# Patient Record
Sex: Male | Born: 1960 | Race: White | Hispanic: No | Marital: Married | Smoking: Former smoker
Health system: Southern US, Community
[De-identification: ages and names within clinical notes are randomized; demographics above are authoritative.]

## PROBLEM LIST (undated history)

## (undated) DIAGNOSIS — N400 Enlarged prostate without lower urinary tract symptoms: Secondary | ICD-10-CM

## (undated) DIAGNOSIS — R0609 Other forms of dyspnea: Secondary | ICD-10-CM

## (undated) DIAGNOSIS — R079 Chest pain, unspecified: Secondary | ICD-10-CM

## (undated) DIAGNOSIS — G4730 Sleep apnea, unspecified: Secondary | ICD-10-CM

## (undated) DIAGNOSIS — R0600 Dyspnea, unspecified: Secondary | ICD-10-CM

## (undated) HISTORY — DX: Other forms of dyspnea: R06.09

## (undated) HISTORY — DX: Benign prostatic hyperplasia without lower urinary tract symptoms: N40.0

## (undated) HISTORY — PX: TONSILLECTOMY: SUR1361

## (undated) HISTORY — DX: Dyspnea, unspecified: R06.00

## (undated) HISTORY — DX: Chest pain, unspecified: R07.9

---

## 2000-08-21 ENCOUNTER — Ambulatory Visit (HOSPITAL_BASED_OUTPATIENT_CLINIC_OR_DEPARTMENT_OTHER): Admission: RE | Admit: 2000-08-21 | Discharge: 2000-08-21 | Payer: Self-pay | Admitting: Family Medicine

## 2011-09-09 HISTORY — PX: HERNIA REPAIR: SHX51

## 2012-02-09 HISTORY — PX: COLONOSCOPY: SHX174

## 2012-02-13 ENCOUNTER — Other Ambulatory Visit: Payer: Self-pay | Admitting: Orthopedic Surgery

## 2012-03-13 ENCOUNTER — Encounter (HOSPITAL_COMMUNITY): Payer: Self-pay | Admitting: Pharmacy Technician

## 2012-03-13 HISTORY — PX: COLONOSCOPY: SHX174

## 2012-03-15 ENCOUNTER — Other Ambulatory Visit (HOSPITAL_COMMUNITY): Payer: Self-pay | Admitting: Orthopedic Surgery

## 2012-03-18 ENCOUNTER — Encounter (HOSPITAL_COMMUNITY)
Admission: RE | Admit: 2012-03-18 | Discharge: 2012-03-18 | Disposition: A | Discharge status: Home / Self Care | Payer: 59 | Source: Ambulatory Visit | Attending: Orthopedic Surgery | Admitting: Orthopedic Surgery

## 2012-03-18 ENCOUNTER — Encounter (HOSPITAL_COMMUNITY): Payer: Self-pay

## 2012-03-18 HISTORY — DX: Sleep apnea, unspecified: G47.30

## 2012-03-18 LAB — CBC
HCT: 41.9 % (ref 39.0–52.0)
Hemoglobin: 14.3 g/dL (ref 13.0–17.0)
MCHC: 34.1 g/dL (ref 30.0–36.0)
MCV: 89.7 fL (ref 78.0–100.0)
RDW: 13.2 % (ref 11.5–15.5)

## 2012-03-18 LAB — SURGICAL PCR SCREEN: Staphylococcus aureus: NEGATIVE

## 2012-03-18 NOTE — Patient Instructions (Addendum)
Dillon Richards  03/18/2012   Your procedure is scheduled on: 03/20/12  Report to [facility] at [phone].  Call this number if you have problems the morning of surgery 336-: [phone]   Remember:   Do not eat food or drink liquids After Midnight.     Take these medicines the morning of surgery with A SIP OF WATER: no medications needed   Do not wear jewelry, make-up or nail polish.  Do not wear lotions, powders, or perfumes. You may wear deodorant.  Do not shave 48 hours prior to surgery. Men may shave face and neck.  Do not bring valuables to the hospital.  Contacts, dentures or bridgework may not be worn into surgery.  Leave suitcase in the car. After surgery it may be brought to your room.  For patients admitted to the hospital, checkout time is 11:00 AM the day of discharge.   Patients discharged the day of surgery will not be allowed to drive home.  Name and phone number of your driver: [name] [phone]   Special Instructions: Shower using CHG 2 nights before surgery and the night before surgery.  If you shower the day of surgery use CHG.  Use special wash - you have one bottle of CHG for all showers.  You should use approximately 1/3 of the bottle for each shower.   Please read over the following fact sheets that you were given: MRSA Information.  [name], RN  pre op nurse call if needed [phone]    FAILURE TO FOLLOW THESE INSTRUCTIONS MAY RESULT IN CANCELLATION OF YOUR SURGERY   Patient Signature: ___________________________________________

## 2012-03-20 ENCOUNTER — Ambulatory Visit (HOSPITAL_COMMUNITY): Payer: 59 | Admitting: Anesthesiology

## 2012-03-20 ENCOUNTER — Ambulatory Visit (HOSPITAL_COMMUNITY)
Admission: RE | Admit: 2012-03-20 | Discharge: 2012-03-20 | Disposition: A | Discharge status: Home / Self Care | Payer: 59 | Source: Ambulatory Visit | Attending: Orthopedic Surgery | Admitting: Orthopedic Surgery

## 2012-03-20 ENCOUNTER — Encounter (HOSPITAL_COMMUNITY)
Admission: RE | Disposition: A | Discharge status: Home / Self Care | Payer: Self-pay | Source: Ambulatory Visit | Attending: Orthopedic Surgery

## 2012-03-20 ENCOUNTER — Encounter (HOSPITAL_COMMUNITY): Payer: Self-pay | Admitting: Anesthesiology

## 2012-03-20 ENCOUNTER — Encounter (HOSPITAL_COMMUNITY): Payer: Self-pay | Admitting: *Deleted

## 2012-03-20 ENCOUNTER — Encounter (HOSPITAL_COMMUNITY): Payer: Self-pay

## 2012-03-20 DIAGNOSIS — Z5331 Laparoscopic surgical procedure converted to open procedure: Secondary | ICD-10-CM | POA: Insufficient documentation

## 2012-03-20 DIAGNOSIS — G4730 Sleep apnea, unspecified: Secondary | ICD-10-CM | POA: Insufficient documentation

## 2012-03-20 DIAGNOSIS — Z79899 Other long term (current) drug therapy: Secondary | ICD-10-CM | POA: Insufficient documentation

## 2012-03-20 DIAGNOSIS — Z9889 Other specified postprocedural states: Secondary | ICD-10-CM

## 2012-03-20 DIAGNOSIS — M19019 Primary osteoarthritis, unspecified shoulder: Secondary | ICD-10-CM | POA: Insufficient documentation

## 2012-03-20 DIAGNOSIS — M25819 Other specified joint disorders, unspecified shoulder: Secondary | ICD-10-CM | POA: Insufficient documentation

## 2012-03-20 DIAGNOSIS — Z01812 Encounter for preprocedural laboratory examination: Secondary | ICD-10-CM | POA: Insufficient documentation

## 2012-03-20 HISTORY — PX: SHOULDER ARTHROSCOPY WITH SUBACROMIAL DECOMPRESSION: SHX5684

## 2012-03-20 SURGERY — SHOULDER ARTHROSCOPY WITH SUBACROMIAL DECOMPRESSION
Anesthesia: Regional | Site: Shoulder | Laterality: Left | Wound class: Clean

## 2012-03-20 MED ORDER — LIDOCAINE HCL (CARDIAC) 20 MG/ML IV SOLN
INTRAVENOUS | Status: DC | PRN
  Administered 2012-03-20: 100 mg via INTRAVENOUS

## 2012-03-20 MED ORDER — METHOCARBAMOL 500 MG PO TABS: 500.0000 mg | ORAL_TABLET | Freq: Four times a day (QID) | ORAL | Status: DC

## 2012-03-20 MED ORDER — ACETAMINOPHEN 10 MG/ML IV SOLN
INTRAVENOUS | Status: AC
  Filled 2012-03-20: qty 100

## 2012-03-20 MED ORDER — MEPERIDINE HCL 50 MG/ML IJ SOLN: 6.2500 mg | INTRAMUSCULAR | Status: DC | PRN

## 2012-03-20 MED ORDER — SUCCINYLCHOLINE CHLORIDE 20 MG/ML IJ SOLN
INTRAMUSCULAR | Status: DC | PRN
  Administered 2012-03-20: 100 mg via INTRAVENOUS

## 2012-03-20 MED ORDER — BUPIVACAINE-EPINEPHRINE (PF) 0.5% -1:200000 IJ SOLN
INTRAMUSCULAR | Status: AC
  Filled 2012-03-20: qty 10

## 2012-03-20 MED ORDER — LACTATED RINGERS IR SOLN
Status: DC | PRN
  Administered 2012-03-20: 6000

## 2012-03-20 MED ORDER — ACETAMINOPHEN 10 MG/ML IV SOLN: 1000.0000 mg | Freq: Once | INTRAVENOUS | Status: DC | PRN

## 2012-03-20 MED ORDER — FENTANYL CITRATE 0.05 MG/ML IJ SOLN: 50.0000 ug | INTRAMUSCULAR | Status: DC | PRN

## 2012-03-20 MED ORDER — PROMETHAZINE HCL 25 MG/ML IJ SOLN: 6.2500 mg | INTRAMUSCULAR | Status: DC | PRN

## 2012-03-20 MED ORDER — MIDAZOLAM HCL 10 MG/2ML IJ SOLN: 1.0000 mg | INTRAMUSCULAR | Status: DC | PRN

## 2012-03-20 MED ORDER — BUPIVACAINE-EPINEPHRINE 0.5% -1:200000 IJ SOLN
INTRAMUSCULAR | Status: DC | PRN
  Administered 2012-03-20: 10 mL

## 2012-03-20 MED ORDER — EPINEPHRINE HCL 1 MG/ML IJ SOLN
INTRAMUSCULAR | Status: AC
  Filled 2012-03-20: qty 2

## 2012-03-20 MED ORDER — EPINEPHRINE HCL 1 MG/ML IJ SOLN
INTRAMUSCULAR | Status: DC | PRN
  Administered 2012-03-20: 2 mg

## 2012-03-20 MED ORDER — OXYCODONE HCL 5 MG/5ML PO SOLN: 5.0000 mg | Freq: Once | ORAL | Status: DC | PRN

## 2012-03-20 MED ORDER — ROPIVACAINE HCL 5 MG/ML IJ SOLN
INTRAMUSCULAR | Status: DC | PRN
  Administered 2012-03-20: 30 mL

## 2012-03-20 MED ORDER — MIDAZOLAM HCL 5 MG/5ML IJ SOLN
INTRAMUSCULAR | Status: DC | PRN
  Administered 2012-03-20: 2 mg via INTRAVENOUS

## 2012-03-20 MED ORDER — FENTANYL CITRATE 0.05 MG/ML IJ SOLN: INTRAMUSCULAR | Status: DC | PRN

## 2012-03-20 MED ORDER — DEXAMETHASONE SODIUM PHOSPHATE 4 MG/ML IJ SOLN
INTRAMUSCULAR | Status: DC | PRN
  Administered 2012-03-20: 10 mg via INTRAVENOUS

## 2012-03-20 MED ORDER — CEFAZOLIN SODIUM-DEXTROSE 2-3 GM-% IV SOLR
INTRAVENOUS | Status: AC
  Filled 2012-03-20: qty 50

## 2012-03-20 MED ORDER — OXYCODONE-ACETAMINOPHEN 7.5-325 MG PO TABS: 1.0000 | ORAL_TABLET | ORAL | Status: DC | PRN

## 2012-03-20 MED ORDER — OXYCODONE HCL 5 MG PO TABS: 5.0000 mg | ORAL_TABLET | Freq: Once | ORAL | Status: DC | PRN

## 2012-03-20 MED ORDER — LACTATED RINGERS IV SOLN
INTRAVENOUS | Status: DC
  Administered 2012-03-20: 1000 mL via INTRAVENOUS

## 2012-03-20 MED ORDER — FENTANYL CITRATE 0.05 MG/ML IJ SOLN
INTRAMUSCULAR | Status: DC | PRN
  Administered 2012-03-20 (×2): 50 ug via INTRAVENOUS

## 2012-03-20 MED ORDER — HYDROMORPHONE HCL PF 1 MG/ML IJ SOLN: 0.2500 mg | INTRAMUSCULAR | Status: DC | PRN

## 2012-03-20 MED ORDER — MIDAZOLAM HCL 5 MG/5ML IJ SOLN: INTRAMUSCULAR | Status: DC | PRN

## 2012-03-20 MED ORDER — ROPIVACAINE HCL 5 MG/ML IJ SOLN
INTRAMUSCULAR | Status: AC
  Filled 2012-03-20: qty 30

## 2012-03-20 MED ORDER — CEFAZOLIN SODIUM-DEXTROSE 2-3 GM-% IV SOLR
2.0000 g | INTRAVENOUS | Status: AC
  Administered 2012-03-20: 2 g via INTRAVENOUS

## 2012-03-20 MED ORDER — PROPOFOL 10 MG/ML IV BOLUS
INTRAVENOUS | Status: DC | PRN
  Administered 2012-03-20: 200 mg via INTRAVENOUS

## 2012-03-20 MED ORDER — ACETAMINOPHEN 10 MG/ML IV SOLN
INTRAVENOUS | Status: DC | PRN
  Administered 2012-03-20: 1000 mg via INTRAVENOUS

## 2012-03-20 MED ORDER — POVIDONE-IODINE 7.5 % EX SOLN: Freq: Once | CUTANEOUS | Status: DC

## 2012-03-20 SURGICAL SUPPLY — 46 items
BLADE 4.2CUDA (BLADE) ×2 IMPLANT
BLADE OSCILLATING/SAGITTAL (BLADE) ×2
BLADE SURG SZ11 CARB STEEL (BLADE) ×2 IMPLANT
BLADE SW THK.38XMED LNG THN (BLADE) IMPLANT
BOOTIES KNEE HIGH SLOAN (MISCELLANEOUS) ×2 IMPLANT
BUR OVAL 4.0 (BURR) ×2 IMPLANT
CLOTH BEACON ORANGE TIMEOUT ST (SAFETY) ×2 IMPLANT
CLSR STERI-STRIP ANTIMIC 1/2X4 (GAUZE/BANDAGES/DRESSINGS) ×1 IMPLANT
DECANTER SPIKE VIAL GLASS SM (MISCELLANEOUS) ×1 IMPLANT
DRAPE POUCH INSTRU U-SHP 10X18 (DRAPES) ×1 IMPLANT
DRAPE SHOULDER BEACH CHAIR (DRAPES) ×2 IMPLANT
DRAPE U-SHAPE 47X51 STRL (DRAPES) ×2 IMPLANT
DRSG ADAPTIC 3X8 NADH LF (GAUZE/BANDAGES/DRESSINGS) ×1 IMPLANT
DRSG EMULSION OIL 3X3 NADH (GAUZE/BANDAGES/DRESSINGS) ×2 IMPLANT
DURAPREP 26ML APPLICATOR (WOUND CARE) ×2 IMPLANT
ELECT REM PT RETURN 9FT ADLT (ELECTROSURGICAL) ×2
ELECTRODE REM PT RTRN 9FT ADLT (ELECTROSURGICAL) IMPLANT
GLOVE BIO SURGEON STRL SZ7.5 (GLOVE) ×2 IMPLANT
GLOVE BIO SURGEON STRL SZ8 (GLOVE) ×4 IMPLANT
GLOVE BIOGEL M 8.0 STRL (GLOVE) ×2 IMPLANT
GLOVE ECLIPSE 8.0 STRL XLNG CF (GLOVE) ×3 IMPLANT
GLOVE INDICATOR 8.0 STRL GRN (GLOVE) ×4 IMPLANT
GOWN STRL REIN XL XLG (GOWN DISPOSABLE) ×4 IMPLANT
KIT BASIN OR (CUSTOM PROCEDURE TRAY) ×1 IMPLANT
KIT POSITION SHOULDER SCHLEI (MISCELLANEOUS) ×2 IMPLANT
MANIFOLD NEPTUNE II (INSTRUMENTS) ×2 IMPLANT
NDL SPNL 18GX3.5 QUINCKE PK (NEEDLE) ×1 IMPLANT
NEEDLE SPNL 18GX3.5 QUINCKE PK (NEEDLE) ×2 IMPLANT
PACK SHOULDER CUSTOM OPM052 (CUSTOM PROCEDURE TRAY) ×2 IMPLANT
POSITIONER SURGICAL ARM (MISCELLANEOUS) ×2 IMPLANT
SET ARTHROSCOPY TUBING (MISCELLANEOUS) ×2
SET ARTHROSCOPY TUBING LN (MISCELLANEOUS) ×1 IMPLANT
SLING ARM IMMOBILIZER LRG (SOFTGOODS) ×1 IMPLANT
SLING ARM IMMOBILIZER MED (SOFTGOODS) ×2 IMPLANT
SPONGE GAUZE 4X4 12PLY (GAUZE/BANDAGES/DRESSINGS) ×1 IMPLANT
SPONGE SURGIFOAM ABS GEL 100 (HEMOSTASIS) ×1 IMPLANT
STAPLER VISISTAT (STAPLE) ×1 IMPLANT
STRAP CHIN BCCS-OSI (MISCELLANEOUS) ×2 IMPLANT
SUT BONE WAX W31G (SUTURE) ×1 IMPLANT
SUT ETHILON 4 0 PS 2 18 (SUTURE) ×2 IMPLANT
SUT VIC AB 1 CT1 27 (SUTURE) ×2
SUT VIC AB 1 CT1 27XBRD ANTBC (SUTURE) IMPLANT
SUT VIC AB 2-0 CT1 27 (SUTURE) ×2
SUT VIC AB 2-0 CT1 27XBRD (SUTURE) IMPLANT
TUBING CONNECTING 10 (TUBING) ×4 IMPLANT
WAND 90 DEG TURBOVAC W/CORD (SURGICAL WAND) ×2 IMPLANT

## 2012-03-20 NOTE — Anesthesia Postprocedure Evaluation (Signed)
Anesthesia Post Note  Patient: Dillon Richards  Procedure(s) Performed: Procedure(s) (LRB): SHOULDER ARTHROSCOPY WITH SUBACROMIAL DECOMPRESSION (Left)  Anesthesia type: General with left interscalene block  Patient location: PACU  Post pain: Pain level controlled  Post assessment: Post-op Vital signs reviewed  Last Vitals: BP 129/80  Pulse 65  Temp 36.4 C (Oral)  Resp 15  SpO2 100%  Post vital signs: Reviewed  Level of consciousness: sedated  Complications: No apparent anesthesia complications

## 2012-03-20 NOTE — H&P (Signed)
Din Bookwalter is an 51 y.o. male.   Chief Complaint:painful lt shoulder HPI:MRI demonstrates ac arthritis and impingement  anatomy  Past Medical History  Diagnosis Date  . Sleep apnea     Past Surgical History  Procedure Date  . Hernia repair june 2013  . Tonsillectomy 51 years old  . Colonoscopy November 2013    History reviewed. No pertinent family history. Social History:  reports that he has never smoked. He has never used smokeless tobacco. He reports that he does not drink alcohol or use illicit drugs.  Allergies: No Known Allergies  Medications Prior to Admission  Medication Sig Dispense Refill  . finasteride (PROSCAR) 5 MG tablet Take 5 mg by mouth daily.      Marland Kitchen terbinafine (LAMISIL) 250 MG tablet Take 250 mg by mouth daily.        Results for orders placed during the hospital encounter of 03/18/12 (from the past 48 hour(s))  SURGICAL PCR SCREEN     Status: Normal   Collection Time   03/18/12  9:15 AM      Component Value Range Comment   MRSA, PCR NEGATIVE  NEGATIVE    Staphylococcus aureus NEGATIVE  NEGATIVE   CBC     Status: Normal   Collection Time   03/18/12  9:25 AM      Component Value Range Comment   WBC 4.1  4.0 - 10.5 K/uL    RBC 4.67  4.22 - 5.81 MIL/uL    Hemoglobin 14.3  13.0 - 17.0 g/dL    HCT 16.1  09.6 - 04.5 %    MCV 89.7  78.0 - 100.0 fL    MCH 30.6  26.0 - 34.0 pg    MCHC 34.1  30.0 - 36.0 g/dL    RDW 40.9  81.1 - 91.4 %    Platelets 285  150 - 400 K/uL    No results found.  ROS  Blood pressure 136/90, pulse 75, temperature 97.7 F (36.5 C), temperature source Oral, resp. rate 18, SpO2 100.00%. Physical Exam   Assessment/Plan Painful lt shoulder due to ac arthritis and rotator cuff impingement Lt shoulder arthroscopy with SAD and open distal clavicle resection  Mukhtar Shams P 03/20/2012, 8:46 AM

## 2012-03-20 NOTE — Anesthesia Procedure Notes (Addendum)
Procedure Name: Intubation Date/Time: 03/20/2012 8:59 AM Performed by: Maris Berger T Pre-anesthesia Checklist: Patient identified, Emergency Drugs available, Suction available and Patient being monitored Patient Re-evaluated:Patient Re-evaluated prior to inductionOxygen Delivery Method: Circle System Utilized Preoxygenation: Pre-oxygenation with 100% oxygen Intubation Type: IV induction Ventilation: Mask ventilation without difficulty Laryngoscope Size: Mac and 4 Grade View: Grade II Tube type: Oral Number of attempts: 1 Airway Equipment and Method: stylet and oral airway Placement Confirmation: ETT inserted through vocal cords under direct vision,  positive ETCO2 and breath sounds checked- equal and bilateral Secured at: 24 cm Tube secured with: Tape Dental Injury: Teeth and Oropharynx as per pre-operative assessment    Anesthesia Regional Block:  Interscalene brachial plexus block  Pre-Anesthetic Checklist: ,, timeout performed, Correct Patient, Correct Site, Correct Laterality, Correct Procedure, Correct Position, site marked, Risks and benefits discussed,  Surgical consent,  Pre-op evaluation,  At surgeon's request and post-op pain management  Laterality: Left  Prep: chloraprep       Needles:  Injection technique: Single-shot  Needle Type: Stimiplex     Needle Length:cm 9 cm Needle Gauge: 20 and 20 G  Needle insertion depth: 4 cm   Additional Needles:  Procedures: ultrasound guided (picture in chart) and nerve stimulator  Motor weakness within 10 minutes. Interscalene brachial plexus block  Nerve Stimulator or Paresthesia:  Response: Deltoid, 0.4 mA, 2 ms,   Additional Responses:   Narrative:  Start time: 03/20/2012 8:33 AM End time: 03/20/2012 8:42 AM Injection made incrementally with aspirations every 5 mL.  Performed by: Personally  Anesthesiologist: Rotha Cassels  Additional Notes: Patient tolerated the procedure well without  complications  Interscalene brachial plexus block

## 2012-03-20 NOTE — Brief Op Note (Signed)
03/20/2012  11:11 AM  PATIENT:  Dillon Richards  51 y.o. male  PRE-OPERATIVE DIAGNOSIS:  LEFT SHOULDER IMPINGEMENT SYNDROME  POST-OPERATIVE DIAGNOSIS:  LEFT SHOULDER IMPINGEMENT SYNDROME  PROCEDURE:  Procedure(s) (LRB) with comments: SHOULDER ARTHROSCOPY WITH SUBACROMIAL DECOMPRESSION (Left) - WITH OPEN DISTAL CLAVICLE RESECTION  SURGEON:  Surgeon(s) and Role:    * Drucilla Schmidt, MD - Primary  PHYSICIAN ASSISTANT: Mr Idolina Primer [facility]  ASSISTANTS: nurse  ANESTHESIA:   regional and general  EBL:  Total I/O In: 1150 [I.V.:1150] Out: -   BLOOD ADMINISTERED:none  DRAINS: none   LOCAL MEDICATIONS USED:  MARCAINE     SPECIMEN:  No Specimen  DISPOSITION OF SPECIMEN:  N/A  COUNTS:  YES  TOURNIQUET:  * No tourniquets in log *  DICTATION: .Other Dictation: Dictation Number [id number]  PLAN OF CARE: Discharge to home after PACU  PATIENT DISPOSITION:  PACU - hemodynamically stable.   Delay start of Pharmacological VTE agent (>24hrs) due to surgical blood loss or risk of bleeding: yes

## 2012-03-20 NOTE — Transfer of Care (Signed)
Immediate Anesthesia Transfer of Care Note  Patient: Dillon Richards  Procedure(s) Performed: Procedure(s) (LRB) with comments: SHOULDER ARTHROSCOPY WITH SUBACROMIAL DECOMPRESSION (Left) - WITH OPEN DISTAL CLAVICLE RESECTION  Patient Location: PACU  Anesthesia Type:General  Level of Consciousness: sedated and responds to stimulation  Airway & Oxygen Therapy: Patient Spontanous Breathing and Patient connected to face mask oxygen  Post-op Assessment: Report given to PACU RN  Post vital signs: Reviewed and stable  Complications: No apparent anesthesia complications

## 2012-03-20 NOTE — Anesthesia Preprocedure Evaluation (Addendum)
Anesthesia Evaluation  Patient identified by MRN, date of birth, ID band Patient awake    Reviewed: Allergy & Precautions, H&P , NPO status , Patient's Chart, lab work & pertinent test results  Airway Mallampati: III TM Distance: >3 FB Neck ROM: Full    Dental  (+) Dental Advisory Given and Teeth Intact   Pulmonary sleep apnea ,  breath sounds clear to auscultation  Pulmonary exam normal       Cardiovascular Exercise Tolerance: Good - CAD, - Past MI and - CHF Rhythm:Regular Rate:Normal     Neuro/Psych negative neurological ROS  negative psych ROS   GI/Hepatic negative GI ROS, Neg liver ROS,   Endo/Other  negative endocrine ROS  Renal/GU negative Renal ROS     Musculoskeletal negative musculoskeletal ROS (+)   Abdominal   Peds  Hematology negative hematology ROS (+)   Anesthesia Other Findings   Reproductive/Obstetrics                          Anesthesia Physical Anesthesia Plan  ASA: II  Anesthesia Plan: General and Regional   Post-op Pain Management:    Induction: Intravenous  Airway Management Planned: Oral ETT  Additional Equipment:   Intra-op Plan:   Post-operative Plan: Extubation in OR  Informed Consent: I have reviewed the patients History and Physical, chart, labs and discussed the procedure including the risks, benefits and alternatives for the proposed anesthesia with the patient or authorized representative who has indicated his/her understanding and acceptance.   Dental advisory given  Plan Discussed with: CRNA  Anesthesia Plan Comments:         Anesthesia Quick Evaluation

## 2012-03-20 NOTE — H&P (Signed)
Dillon Richards is an 51 y.o. male.   Chief Complaint:painful lt shoulder HPI:MRI demonstrates sloped acromion and ac arthritis but no rotator cuff tear  Past Medical History  Diagnosis Date  . Sleep apnea     Past Surgical History  Procedure Date  . Hernia repair june 2013  . Tonsillectomy 51 years old  . Colonoscopy November 2013    History reviewed. No pertinent family history. Social History:  reports that he has never smoked. He has never used smokeless tobacco. He reports that he does not drink alcohol or use illicit drugs.  Allergies: No Known Allergies  Medications Prior to Admission  Medication Sig Dispense Refill  . finasteride (PROSCAR) 5 MG tablet Take 5 mg by mouth daily.      Marland Kitchen terbinafine (LAMISIL) 250 MG tablet Take 250 mg by mouth daily.        Results for orders placed during the hospital encounter of 03/18/12 (from the past 48 hour(s))  SURGICAL PCR SCREEN     Status: Normal   Collection Time   03/18/12  9:15 AM      Component Value Range Comment   MRSA, PCR NEGATIVE  NEGATIVE    Staphylococcus aureus NEGATIVE  NEGATIVE   CBC     Status: Normal   Collection Time   03/18/12  9:25 AM      Component Value Range Comment   WBC 4.1  4.0 - 10.5 K/uL    RBC 4.67  4.22 - 5.81 MIL/uL    Hemoglobin 14.3  13.0 - 17.0 g/dL    HCT 56.2  13.0 - 86.5 %    MCV 89.7  78.0 - 100.0 fL    MCH 30.6  26.0 - 34.0 pg    MCHC 34.1  30.0 - 36.0 g/dL    RDW 78.4  69.6 - 29.5 %    Platelets 285  150 - 400 K/uL    No results found.  ROS  Blood pressure 136/90, pulse 75, temperature 97.7 F (36.5 C), temperature source Oral, resp. rate 18, SpO2 100.00%. Physical Exam  Constitutional: He is oriented to person, place, and time. He appears well-developed and well-nourished.  HENT:  Head: Normocephalic and atraumatic.  Right Ear: External ear normal.  Left Ear: External ear normal.  Eyes: Conjunctivae normal and EOM are normal. Pupils are equal, round, and reactive to  light.  Neck: Normal range of motion. Neck supple.  Cardiovascular: Normal rate and regular rhythm.   Respiratory: Effort normal and breath sounds normal.  GI: Soft. Bowel sounds are normal.  Musculoskeletal: Normal range of motion. He exhibits tenderness.       Lt shoulder--tender ac joint and rotator cuff  Neurological: He is alert and oriented to person, place, and time. He has normal reflexes.  Skin: Skin is warm and dry.  Psychiatric: He has a normal mood and affect. His behavior is normal. Judgment and thought content normal.     Assessment/Plan Painful lt shoulder due to ac arthritis and rotator cuff impingement  APLINGTON,JAMES P 03/20/2012, 8:42 AM

## 2012-03-21 ENCOUNTER — Encounter (HOSPITAL_COMMUNITY): Payer: Self-pay | Admitting: Orthopedic Surgery

## 2012-03-21 NOTE — Op Note (Signed)
NAMENICHAEL, EHLY        ACCOUNT NO.:  [id number]  MEDICAL RECORD NO.:  [id number]  LOCATION:  [facility]                         FACILITY:  [facility]  PHYSICIAN:  Marlowe Kays, M.D.  DATE OF BIRTH:  10-May-1960  DATE OF PROCEDURE:  03/20/2012 DATE OF DISCHARGE:  03/20/2012                              OPERATIVE REPORT   PREOPERATIVE DIAGNOSES: 1. Chronic impingement syndrome, left shoulder. 2. Acromioclavicular joint arthritis, left shoulder.  POSTOPERATIVE DIAGNOSES: 1. Chronic impingement syndrome, left shoulder. 2. Acromioclavicular joint arthritis, left shoulder.  OPERATION: 1. Left shoulder arthroscopy with inspection of glenohumeral joint     (normal exam) and arthroscopic subacromial decompression. 2. Open distal clavicle resection.  SURGEON:  Marlowe Kays, M.D.  ASSISTANTDruscilla Brownie. Cherlynn June.  ANESTHESIA:  Interscalene block followed by general anesthesia.  PATHOLOGY AND JUSTIFICATION FOR PROCEDURE:  Painful left shoulder with an MRI demonstrating an impingement-type picture bone wise and plain films and MRI demonstrating AC joint arthritis.  Mr. Angie Fava assistance was required because of complexity of the operation with holding the arm and assisting with the instrumentation.  PROCEDURE:  Satisfactory interscalene block by anesthesia, prophylactic antibiotics, beach-chair position on the Odem frame.  Left shoulder girdle was prepped with DuraPrep and draped in a sterile field.  Time- out performed.  Anatomy of the shoulder joint was marked out and I injected the subacromial space.  Placement of the lateral portal and posterior portals with Marcaine with adrenaline.  Through a posterior soft spot portal, I atraumatically entered the glenohumeral joint.  He had a small area of wear of the humeral head, but otherwise glenohumeral joint looked normal.  I then redirected the scope into the subacromial space.  There was a large amount of bursal tissue  there, which I resected through the lateral portal using a 4.2 shaver.  I then filed this with a 90-degree ArthroCare vaporizer, removing soft tissue from the undersurface of the acromion.  One large spur-type structure in particular was pictured.  I then used a 4-mm oval bur to bur down the underneath surface of the acromion.  I went back and forth between the bur, the vaporizer and the 4.2 shaver until we had a wide decompression documented with films.  I then removed all fluid possible from the joint and made an open incision over the distal clavicle with subperiosteal dissection using cutting cautery, identified the [facility] joint and then I undermined the distal clavicle and measured a 1.5 cm from the joint and using microsaw and protecting the underlying tissues, amputated the clavicle at this point, removing the cut fragment.  I checked there were no bony spicules.  The clavicle itself terminal end was quite arthritic. I then covered the raw bone with bone wax and irrigated the wound well with saline and placed Gelfoam in the resection space.  I then closed the wound in layers with interrupted #1 Vicryl in the fascia, 2-0 Vicryl, subcutaneous tissue and staples in the skin incision and in the portals.  Betadine, Adaptic, and dry sterile dressing were applied followed by shoulder immobilizer.  He tolerated the procedure well, was taken to the recovery room in satisfied condition with no known complications.  ______________________________ Marlowe Kays, M.D.     JA/MEDQ  D:  03/20/2012  T:  03/21/2012  Job:  [id number]

## 2012-03-25 MED FILL — Lactated Ringer's for Irrigation: Qty: 6000 | Status: AC

## 2013-05-15 ENCOUNTER — Encounter: Payer: Self-pay | Admitting: Podiatrist

## 2016-06-29 DIAGNOSIS — D225 Melanocytic nevi of trunk: Secondary | ICD-10-CM | POA: Diagnosis not present

## 2016-06-29 DIAGNOSIS — L638 Other alopecia areata: Secondary | ICD-10-CM | POA: Diagnosis not present

## 2016-06-29 DIAGNOSIS — L820 Inflamed seborrheic keratosis: Secondary | ICD-10-CM | POA: Diagnosis not present

## 2016-06-29 DIAGNOSIS — L720 Epidermal cyst: Secondary | ICD-10-CM | POA: Diagnosis not present

## 2016-09-05 DIAGNOSIS — L638 Other alopecia areata: Secondary | ICD-10-CM | POA: Diagnosis not present

## 2016-09-05 DIAGNOSIS — D1801 Hemangioma of skin and subcutaneous tissue: Secondary | ICD-10-CM | POA: Diagnosis not present

## 2016-09-05 DIAGNOSIS — L720 Epidermal cyst: Secondary | ICD-10-CM | POA: Diagnosis not present

## 2016-09-05 DIAGNOSIS — L821 Other seborrheic keratosis: Secondary | ICD-10-CM | POA: Diagnosis not present

## 2017-05-31 DIAGNOSIS — L570 Actinic keratosis: Secondary | ICD-10-CM | POA: Diagnosis not present

## 2017-05-31 DIAGNOSIS — D225 Melanocytic nevi of trunk: Secondary | ICD-10-CM | POA: Diagnosis not present

## 2017-05-31 DIAGNOSIS — L219 Seborrheic dermatitis, unspecified: Secondary | ICD-10-CM | POA: Diagnosis not present

## 2017-05-31 DIAGNOSIS — D485 Neoplasm of uncertain behavior of skin: Secondary | ICD-10-CM | POA: Diagnosis not present

## 2017-05-31 DIAGNOSIS — L821 Other seborrheic keratosis: Secondary | ICD-10-CM | POA: Diagnosis not present

## 2017-11-20 DIAGNOSIS — J040 Acute laryngitis: Secondary | ICD-10-CM | POA: Diagnosis not present

## 2017-11-20 DIAGNOSIS — J069 Acute upper respiratory infection, unspecified: Secondary | ICD-10-CM | POA: Diagnosis not present

## 2017-12-28 DIAGNOSIS — L820 Inflamed seborrheic keratosis: Secondary | ICD-10-CM | POA: Diagnosis not present

## 2017-12-28 DIAGNOSIS — L720 Epidermal cyst: Secondary | ICD-10-CM | POA: Diagnosis not present

## 2017-12-28 DIAGNOSIS — B351 Tinea unguium: Secondary | ICD-10-CM | POA: Diagnosis not present

## 2018-03-28 DIAGNOSIS — J0100 Acute maxillary sinusitis, unspecified: Secondary | ICD-10-CM | POA: Diagnosis not present

## 2018-05-14 DIAGNOSIS — J0100 Acute maxillary sinusitis, unspecified: Secondary | ICD-10-CM | POA: Diagnosis not present

## 2018-06-25 DIAGNOSIS — M7712 Lateral epicondylitis, left elbow: Secondary | ICD-10-CM | POA: Diagnosis not present

## 2018-06-25 DIAGNOSIS — M25522 Pain in left elbow: Secondary | ICD-10-CM | POA: Diagnosis not present

## 2018-11-18 ENCOUNTER — Ambulatory Visit
Admission: RE | Admit: 2018-11-18 | Discharge: 2018-11-18 | Disposition: A | Discharge status: Home / Self Care | Payer: 59 | Source: Ambulatory Visit | Attending: Family Medicine | Admitting: Family Medicine

## 2018-11-18 ENCOUNTER — Other Ambulatory Visit: Payer: Self-pay | Admitting: Family Medicine

## 2018-11-18 DIAGNOSIS — R0602 Shortness of breath: Secondary | ICD-10-CM

## 2018-11-28 ENCOUNTER — Other Ambulatory Visit: Payer: Self-pay

## 2018-11-28 ENCOUNTER — Ambulatory Visit (INDEPENDENT_AMBULATORY_CARE_PROVIDER_SITE_OTHER): Payer: 59 | Admitting: Cardiology

## 2018-11-28 ENCOUNTER — Encounter: Payer: Self-pay | Admitting: Cardiology

## 2018-11-28 VITALS — BP 114/76 | HR 74 | Ht 75.0 in | Wt 197.0 lb

## 2018-11-28 DIAGNOSIS — R0609 Other forms of dyspnea: Secondary | ICD-10-CM

## 2018-11-28 DIAGNOSIS — R079 Chest pain, unspecified: Secondary | ICD-10-CM | POA: Diagnosis not present

## 2018-11-28 MED ORDER — ASPIRIN EC 81 MG PO TBEC: 81.0000 mg | DELAYED_RELEASE_TABLET | Freq: Every day | ORAL | 2 refills | Status: DC

## 2018-11-28 NOTE — Progress Notes (Signed)
Patient referred by [name], MD for chest pressure, dyspnea on exertion.  Subjective:   Dillon Richards, male    DOB: 22-Dec-1960, 58 y.o.   MRN: [id number]   Chief Complaint  Patient presents with  . Chest Pain    pt c/o 6-8 months of sob/chest pain  . Shortness of Breath  . Fatigue     HPI  58 y.o. Caucasian male with no significant medical comorbidities, referred for evaluation and management of chest pressure and dyspnea on exertion.  Patient works for [employer], and stays pretty active with outdoor work.  For last few weeks, he has experienced substernal chest pressure and dyspnea with certain level of physical exertion, which improves with rest.  He does not have symptoms with walking on flat surface, but experiences symptoms while climbing up the hill.  Symptoms are infrequent.  He denies any palpitations, presyncope, syncope, leg edema, orthopnea, PND.  He was given sublingual nitroglycerin by his PCP, which she has not used yet.   Past Medical History:  Diagnosis Date  . Sleep apnea       Past Surgical History:  Procedure Laterality Date  . COLONOSCOPY  November 2013  . HERNIA REPAIR  june 2013  . SHOULDER ARTHROSCOPY WITH SUBACROMIAL DECOMPRESSION  03/20/2012   Procedure: SHOULDER ARTHROSCOPY WITH SUBACROMIAL DECOMPRESSION;  Surgeon: Magnus Sinning, MD;  Location: [facility];  Service: Orthopedics;  Laterality: Left;  WITH OPEN DISTAL CLAVICLE RESECTION  . TONSILLECTOMY  58 years old      Social History   Socioeconomic History  . Marital status: Married    Spouse name: 2  . Number of children: Not on file  . Years of education: Not on file  . Highest education level: Not on file  Occupational History  . Not on file  Social Needs  . Financial resource strain: Not on file  . Food insecurity    Worry: Not on file    Inability: Not on file  . Transportation needs    Medical: Not on file    Non-medical: Not on file  Tobacco Use  .  Smoking status: Former Smoker    Years: 5.00    Types: Cigarettes  . Smokeless tobacco: Never Used  . Tobacco comment: smoked as a teenager   Substance and Sexual Activity  . Alcohol use: No  . Drug use: No  . Sexual activity: Not on file  Lifestyle  . Physical activity    Days per week: Not on file    Minutes per session: Not on file  . Stress: Not on file  Relationships  . Social Herbalist on phone: Not on file    Gets together: Not on file    Attends religious service: Not on file    Active member of club or organization: Not on file    Attends meetings of clubs or organizations: Not on file    Relationship status: Not on file  . Intimate partner violence    Fear of current or ex partner: Not on file    Emotionally abused: Not on file    Physically abused: Not on file    Forced sexual activity: Not on file  Other Topics Concern  . Not on file  Social History Narrative  . Not on file      Family History  Problem Relation Age of Onset  . Breast cancer Mother   . Anuerysm Father   . Hypertension Father   .  Arrhythmia Brother       Current Outpatient Medications on File Prior to Visit  Medication Sig Dispense Refill  . nitroGLYCERIN (NITROSTAT) 0.4 MG SL tablet Place 1 tablet under the tongue as needed.     No current facility-administered medications on file prior to visit.     Cardiovascular studies:  EKG 11/08/2018: Sinus rhythm. Normal EKG.  Recent labs: 11/09/2018:  Glucose 81. BUN/Cr 17/0.66. eGFR normal. Na/K 144/4.8. Rest of the CMP normal.  H/H 14/43. MCV 90. Platelets 304 TSH 1.5. Normal.  Review of Systems  Constitution: Negative for decreased appetite, malaise/fatigue, weight gain and weight loss.  HENT: Negative for congestion.   Eyes: Negative for visual disturbance.  Cardiovascular: Positive for chest pain and dyspnea on exertion. Negative for leg swelling, palpitations and syncope.  Respiratory: Negative for cough.    Endocrine: Negative for cold intolerance.  Hematologic/Lymphatic: Does not bruise/bleed easily.  Skin: Negative for itching and rash.  Musculoskeletal: Negative for myalgias.  Gastrointestinal: Negative for abdominal pain, nausea and vomiting.  Genitourinary: Negative for dysuria.  Neurological: Negative for dizziness and weakness.  Psychiatric/Behavioral: The patient is not nervous/anxious.   All other systems reviewed and are negative.        Vitals:   11/28/18 0940  BP: 114/76  Pulse: 74  SpO2: 97%     Body mass index is 24.62 kg/m. Filed Weights   11/28/18 0940  Weight: 89.4 kg    Objective:   Physical Exam  Constitutional: He is oriented to person, place, and time. He appears well-developed and well-nourished. No distress.  HENT:  Head: Normocephalic and atraumatic.  Eyes: Pupils are equal, round, and reactive to light. Conjunctivae are normal.  Neck: No JVD present.  Cardiovascular: Normal rate, regular rhythm and intact distal pulses.  Pulmonary/Chest: Effort normal and breath sounds normal. He has no wheezes. He has no rales.  Abdominal: Soft. Bowel sounds are normal. There is no rebound.  Musculoskeletal:        General: No edema.  Lymphadenopathy:    He has no cervical adenopathy.  Neurological: He is alert and oriented to person, place, and time. No cranial nerve deficit.  Skin: Skin is warm and dry.  Psychiatric: He has a normal mood and affect.  Nursing note and vitals reviewed.         Assessment & Recommendations:   58 y.o. Caucasian male with no significant medical comorbidities, referred for evaluation and management of chest pressure and dyspnea on exertion.  Chest pain and dyspnea on exertion: Physical exam, resting EKG.  I suspect his symptoms are likely to be angina.  Symptoms are infrequent at this time and only occur with more than usual exertion.  I recommend continued use of as needed sublingual nitroglycerin.  Recommend starting  aspirin 81 mg daily.  Recommend exercise nuclear stress test.  He is going to get his lipid panel with his PCP tomorrow.  Further recommendations after the above tests.  Thank you for referring the patient to [place]. Please feel free to contact with any questions.  [name], MD [facility] Pager: [phone] Office: [phone] If no answer Cell [phone]

## 2018-12-23 ENCOUNTER — Other Ambulatory Visit: Payer: Self-pay

## 2018-12-23 ENCOUNTER — Ambulatory Visit (INDEPENDENT_AMBULATORY_CARE_PROVIDER_SITE_OTHER): Payer: 59

## 2018-12-23 DIAGNOSIS — R0789 Other chest pain: Secondary | ICD-10-CM | POA: Diagnosis not present

## 2018-12-23 DIAGNOSIS — R0609 Other forms of dyspnea: Secondary | ICD-10-CM | POA: Diagnosis not present

## 2018-12-24 NOTE — Progress Notes (Signed)
Left detailed message on machine.

## 2019-01-01 ENCOUNTER — Encounter: Payer: Self-pay | Admitting: Cardiology

## 2019-01-01 ENCOUNTER — Telehealth (INDEPENDENT_AMBULATORY_CARE_PROVIDER_SITE_OTHER): Payer: 59 | Admitting: Cardiology

## 2019-01-01 ENCOUNTER — Other Ambulatory Visit: Payer: Self-pay

## 2019-01-01 VITALS — Ht 75.0 in | Wt 195.0 lb

## 2019-01-01 DIAGNOSIS — R0609 Other forms of dyspnea: Secondary | ICD-10-CM

## 2019-01-01 NOTE — Progress Notes (Signed)
  Patient referred by Slatosky, John J., MD for chest pressure, dyspnea on exertion.  Subjective:   Dillon Richards, male    DOB: 12/13/1960, 57 y.o.   MRN: [id number]   I connected with the patient on 01/02/2019 by a video enabled telemedicine application and verified that I am speaking with the correct person using two identifiers.     I discussed the limitations of evaluation and management by telemedicine and the availability of in person appointments. The patient expressed understanding and agreed to proceed.   This visit type was conducted due to national recommendations for restrictions regarding the COVID-19 Pandemic (e.g. social distancing).  This format is felt to be most appropriate for this patient at this time.  All issues noted in this document were discussed and addressed.  No physical exam was performed (except for noted visual exam findings with Tele health visits).  The patient has consented to conduct a Tele health visit and understands insurance will be billed.    Chief Complaint  Patient presents with  . Chest Pain  . DOE  . Follow-up    4 weeks  . Results    NUC     HPI  57 y.o. Caucasian male with no significant medical comorbidities, referred for evaluation and management of chest pressure and dyspnea on exertion.  Patient underwent exercise nuclear stress tests.  He walked for 30 minutes and achieved 17 METS without any chest pain, shortness of breath or EKG abnormality.  SPECT imaging showed decrease tracer uptake in inferior myocardium at rest with reasonably normal perfusion at stress.  This was thought to be related to tissue alternation artifact.  Patient has not had any episodes of chest pain since then.  He walks for an hour at 15 minute per mile pace without any difficulty.  However, he is very concerned that on heart [place] days, he notices shortness of breath on after walking.  He suspects that his recent improvement in symptoms is due to colder  weather.  He remains very concerned about his symptoms.  Past Medical History:  Diagnosis Date  . Chest pain   . DOE (dyspnea on exertion)   . Sleep apnea       Past Surgical History:  Procedure Laterality Date  . COLONOSCOPY  November 2013  . HERNIA REPAIR  june 2013  . SHOULDER ARTHROSCOPY WITH SUBACROMIAL DECOMPRESSION  03/20/2012   Procedure: SHOULDER ARTHROSCOPY WITH SUBACROMIAL DECOMPRESSION;  Surgeon: James P Aplington, MD;  Location: [facility];  Service: Orthopedics;  Laterality: Left;  WITH OPEN DISTAL CLAVICLE RESECTION  . TONSILLECTOMY  58 years old      Social History   Socioeconomic History  . Marital status: Married    Spouse name: 2  . Number of children: Not on file  . Years of education: Not on file  . Highest education level: Not on file  Occupational History  . Not on file  Social Needs  . Financial resource strain: Not on file  . Food insecurity    Worry: Not on file    Inability: Not on file  . Transportation needs    Medical: Not on file    Non-medical: Not on file  Tobacco Use  . Smoking status: Former Smoker    Years: 5.00    Types: Cigarettes  . Smokeless tobacco: Never Used  . Tobacco comment: smoked as a teenager   Substance and Sexual Activity  . Alcohol use: No  . Drug use: No  . Sexual   activity: Not on file  Lifestyle  . Physical activity    Days per week: Not on file    Minutes per session: Not on file  . Stress: Not on file  Relationships  . Social Herbalist on phone: Not on file    Gets together: Not on file    Attends religious service: Not on file    Active member of club or organization: Not on file    Attends meetings of clubs or organizations: Not on file    Relationship status: Not on file  . Intimate partner violence    Fear of current or ex partner: Not on file    Emotionally abused: Not on file    Physically abused: Not on file    Forced sexual activity: Not on file  Other Topics Concern  . Not on  file  Social History Narrative  . Not on file      Family History  Problem Relation Age of Onset  . Breast cancer Mother   . Anuerysm Father   . Hypertension Father   . Arrhythmia Brother       Current Outpatient Medications on File Prior to Visit  Medication Sig Dispense Refill  . aspirin EC 81 MG tablet Take 1 tablet (81 mg total) by mouth daily. 30 tablet 2  . nitroGLYCERIN (NITROSTAT) 0.4 MG SL tablet Place 1 tablet under the tongue as needed.     No current facility-administered medications on file prior to visit.     Cardiovascular studies:  EKG 11/08/2018: Sinus rhythm. Normal EKG.  Recent labs: 11/29/2018: Chol 179, TG 64, HDL 43, LDL 123.   11/09/2018:  Glucose 81. BUN/Cr 17/0.66. eGFR normal. Na/K 144/4.8. Rest of the CMP normal.  H/H 14/43. MCV 90. Platelets 304 TSH 1.5. Normal.  Review of Systems  Constitution: Negative for decreased appetite, malaise/fatigue, weight gain and weight loss.  HENT: Negative for congestion.   Eyes: Negative for visual disturbance.  Cardiovascular: Positive for chest pain and dyspnea on exertion. Negative for leg swelling, palpitations and syncope.  Respiratory: Negative for cough.   Endocrine: Negative for cold intolerance.  Hematologic/Lymphatic: Does not bruise/bleed easily.  Skin: Negative for itching and rash.  Musculoskeletal: Negative for myalgias.  Gastrointestinal: Negative for abdominal pain, nausea and vomiting.  Genitourinary: Negative for dysuria.  Neurological: Negative for dizziness and weakness.  Psychiatric/Behavioral: The patient is not nervous/anxious.   All other systems reviewed and are negative.      No vitals available    Objective:    Unable to perform exam due to technical reasons.     Assessment & Recommendations:   58 y.o. Caucasian male with no significant medical comorbidities, referred for evaluation and management of chest pressure and dyspnea on exertion.  Chest pain and  dyspnea on exertion: Excellent exercise capacity with no ischemia on EKG.  Stress imaging with normal perfusion, with rest imaging showing decrease inferior tracer uptake, likely due to tissue attenuation artifact.  I discussed the findings with the patient at length.  This test likely excludes obstructive CAD, but mild CAD cannot be excluded.  He remains concerned with his occasional symptoms of shortness of breath.  I will obtain echocardiogram and office visit in November.  Discussed diet and lifestyle modifications.  Given ASCVD risk of 6%, statin not indicated at this time.  In absence of any bleeding issues, okay to continue aspirin.  [name], MD [facility] Pager: [phone] Office: [phone] If no  answer Cell [phone]

## 2019-01-02 ENCOUNTER — Ambulatory Visit: Payer: 59 | Admitting: Cardiology

## 2019-02-10 ENCOUNTER — Other Ambulatory Visit: Payer: Self-pay

## 2019-02-10 ENCOUNTER — Ambulatory Visit (INDEPENDENT_AMBULATORY_CARE_PROVIDER_SITE_OTHER): Payer: 59 | Admitting: Podiatry

## 2019-02-10 ENCOUNTER — Encounter: Payer: Self-pay | Admitting: Podiatry

## 2019-02-10 ENCOUNTER — Ambulatory Visit (INDEPENDENT_AMBULATORY_CARE_PROVIDER_SITE_OTHER): Payer: 59

## 2019-02-10 VITALS — BP 149/88 | HR 68 | Resp 16

## 2019-02-10 DIAGNOSIS — M722 Plantar fascial fibromatosis: Secondary | ICD-10-CM | POA: Diagnosis not present

## 2019-02-10 NOTE — Patient Instructions (Signed)
Pre-Operative Instructions  Congratulations, you have decided to take an important step towards improving your quality of life.  You can be assured that the doctors and staff at [facility] will be with you every step of the way.  Here are some important things you should know:  1. Plan to be at the surgery center/hospital at least 1 (one) hour prior to your scheduled time, unless otherwise directed by the surgical center/hospital staff.  You must have a responsible adult accompany you, remain during the surgery and drive you home.  Make sure you have directions to the surgical center/hospital to ensure you arrive on time. 2. If you are having surgery at [facility] or [facility], you will need a copy of your medical history and physical form from your family physician within one month prior to the date of surgery. We will give you a form for your primary physician to complete.  3. We make every effort to accommodate the date you request for surgery.  However, there are times where surgery dates or times have to be moved.  We will contact you as soon as possible if a change in schedule is required.   4. No aspirin/ibuprofen for one week before surgery.  If you are on aspirin, any non-steroidal anti-inflammatory medications (Mobic, Aleve, Ibuprofen) should not be taken seven (7) days prior to your surgery.  You make take Tylenol for pain prior to surgery.  5. Medications - If you are taking daily heart and blood pressure medications, seizure, reflux, allergy, asthma, anxiety, pain or diabetes medications, make sure you notify the surgery center/hospital before the day of surgery so they can tell you which medications you should take or avoid the day of surgery. 6. No food or drink after midnight the night before surgery unless directed otherwise by surgical center/hospital staff. 7. No alcoholic beverages 24-hours prior to surgery.  No smoking 24-hours prior or 24-hours after  surgery. 8. Wear loose pants or shorts. They should be loose enough to fit over bandages, boots, and casts. 9. Don't wear slip-on shoes. Sneakers are preferred. 10. Bring your boot with you to the surgery center/hospital.  Also bring crutches or a walker if your physician has prescribed it for you.  If you do not have this equipment, it will be provided for you after surgery. 11. If you have not been contacted by the surgery center/hospital by the day before your surgery, call to confirm the date and time of your surgery. 12. Leave-time from work may vary depending on the type of surgery you have.  Appropriate arrangements should be made prior to surgery with your employer. 13. Prescriptions will be provided immediately following surgery by your doctor.  Fill these as soon as possible after surgery and take the medication as directed. Pain medications will not be refilled on weekends and must be approved by the doctor. 14. Remove nail polish on the operative foot and avoid getting pedicures prior to surgery. 15. Wash the night before surgery.  The night before surgery wash the foot and leg well with water and the antibacterial soap provided. Be sure to pay special attention to beneath the toenails and in between the toes.  Wash for at least three (3) minutes. Rinse thoroughly with water and dry well with a towel.  Perform this wash unless told not to do so by your physician.  Enclosed: 1 Ice pack (please put in freezer the night before surgery)   1 Hibiclens skin cleaner     Pre-op instructions  If you have any questions regarding the instructions, please do not hesitate to call our office.  [place]: [address], [place], [place] [address] -- [phone]  [place]: [address] [place], [place] [address] -- [phone]  [place]: [address]  [place], [place] [address] -- [phone]   Website: [email or web address] 

## 2019-02-10 NOTE — Progress Notes (Signed)
Subjective:   Patient ID: Dillon Richards, male   DOB: 58 y.o.   MRN: [id number]   HPI Patient states his heel continues to be very sore and states recently has gotten much worse but is really not been well over the last number of years and would like to look for a definitive solution to the problem   ROS      Objective:  Physical Exam  Neurovascular status intact with chronic discomfort of the plantar fascial left at the insertional point tendon calcaneus with inflammation fluid of the medial band that has not gotten better over a period of treatment including injection treatment anti-inflammatories physical therapy orthotics cast immobilization and shockwave therapy     Assessment:  Chronic plantar fasciitis left that does not allow him to be as active as he wants secondary to the pain     Plan:  H&P reviewed condition discussed options and at this point endoscopic surgery recommended.  Patient wants surgery and at this point I reviewed the surgery what would be required and allowed him to read the consent form going over all possible complications associated with this type procedure and alternative treatments.  He is amendable to surgery understands total recovery can take upwards of 6 months for complete recovery and after extensive review signed consent form.  We will get him his boot prior to the procedure which hopefully will be done in the next few weeks but he may have to delay it by a short period of time the patient is encouraged to call with questions or concerns he may have

## 2019-02-12 ENCOUNTER — Telehealth: Payer: Self-pay | Admitting: Podiatry

## 2019-02-12 NOTE — Telephone Encounter (Signed)
I have surgery scheduled with Dr. Paulla Dolly on 03/11/2019. I have some questions I would like to ask. Also, I'd like to get an estimate for insurance comparison.

## 2019-02-12 NOTE — Telephone Encounter (Signed)
I returned pt's call. Pt is stating he wants an estimate to decide if he wants to go ahead with surgery or wait until January when he get his new plan. Pt then had stated he noticed on his x-ray that he has a pronounced bone spur. Stated Dr. Paulla Dolly told him he would only ben doing the EPF surgery and not touching the bone spur. Pt wants to know if the tendon is wrapping around the bone spur and if the bone spur would cause problems down the road. Pt also wanted to know how long he would be out of work for. I asked the pt if he had a standing job or a job where he could sit. 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 I asked Delydia and related to the pt he would be able to walk out of the surgical center and would probably only be out of work for a week. I told pt Jocelyn Lamer in insurance would call with our portion of the estimate tomorrow and that he could contact the surgical center to get their estimate as well as the estimate for the anesthesiologist.

## 2019-02-12 NOTE — Telephone Encounter (Signed)
The bone spur is not part of the problem for this condition. The tendon inserting into the bone is the pathology and is released

## 2019-02-13 NOTE — Telephone Encounter (Signed)
I informed pt of Dr. Mellody Drown information, pt states he wishes he had thought to ask at the appt. I offered pt an appt to come in to discuss with Dr. Paulla Dolly and pt states it was more of a curiosity. Pt states Jocelyn Lamer was going to call with the estimate for the surgery. I transferred to [place].

## 2019-02-14 ENCOUNTER — Telehealth: Payer: Self-pay | Admitting: Podiatry

## 2019-02-14 NOTE — Telephone Encounter (Signed)
I wanted to confirm one other thing with you regarding my surgery scheduled for 03/11/2019.

## 2019-02-14 NOTE — Telephone Encounter (Signed)
Called pt back in regards to his questions. Pt stated paperwork he got showed he has plantar fasciitis of the right foot and wanted to know what foot we had down on the surgery forms that he is to have his surgery on. I told him we have it down for the left foot and I saw in the note where it said the diagnosis was for the right foot but when I looked down in the note it stated the left foot. Pt wanted to know if he would be able to work after surgery. 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 Pt stated he works from home a lot and I said that would probably work but they would prefer him to keep his foot elevated to alleviate swelling and possible postop bleeding. Pt stated he will discuss with his wife having surgery on 03/04/19 due to work projects and would call me back next week.

## 2019-02-19 ENCOUNTER — Other Ambulatory Visit: Payer: 59

## 2019-02-19 ENCOUNTER — Ambulatory Visit: Payer: 59 | Admitting: Cardiology

## 2019-02-25 ENCOUNTER — Telehealth: Payer: Self-pay | Admitting: Podiatry

## 2019-02-25 NOTE — Telephone Encounter (Addendum)
I'm calling to confirm my surgery date for 03/11/2019. I told pt we had him scheduled for that date and he would get a call a day or two prior to let him know what time to arrive. I told him he would probably get the phone call the day before as the Friday before is the day after Thanksgiving.   Pt stated he saw a very pronounced bone spur and Dr. Paulla Dolly made a comment they don't take bone spurs off of the heel anymore. I was wondering why they don't remove when the ligament is cut at the same time. Also, where will he be cutting the ligament at? I was wondering if its near the bone spur, could he not go ahead and remove the bone spur as well or would that change the surgery?  Does he do this surgery in the surgical center in [place]? I told the pt Dr. Paulla Dolly only does surgery at [facility]. Pt also wanted to know how long he would need to wear the boot for after surgery and be non weight bearing for? Also, would he have any bandages under the boot? I told the pt for the first week he would need to be in the boot and they prefer you not to be on the foot more than 15 minutes an hour and that when he comes in for his first postop visit, Dr. Paulla Dolly would then tell him when he can become more weight bearing and the status of wearing his boot. Pt stated he has a sit down job and would be able to work from home.

## 2019-02-25 NOTE — Telephone Encounter (Signed)
Called pt to let him know I would need to schedule him an appointment to see Dr. Paulla Dolly to go over the questions he has. Pt wanted to know if the nurse or Dr. Paulla Dolly could just call him and I stated Dr. Paulla Dolly would prefer to see him in the office. I scheduled the pt to be seen this Friday, 11/20 at 11:30 am.

## 2019-02-26 ENCOUNTER — Telehealth: Payer: Self-pay | Admitting: Podiatry

## 2019-02-26 NOTE — Telephone Encounter (Signed)
DOS: 03/11/2019  SURGICAL PROCEDURE: Endoscopic Plantar Fasciotomy Med Band [id number])  [employer] Policy Effective: AB-[id number] -   Prior Authorization# [id number] was covered/approved 02/25/2019.

## 2019-02-28 ENCOUNTER — Ambulatory Visit: Payer: 59 | Admitting: Podiatry

## 2019-03-05 ENCOUNTER — Telehealth: Payer: Self-pay | Admitting: Podiatry

## 2019-03-05 NOTE — Telephone Encounter (Signed)
Pt called wanting to know if he could have his Rx picked up by his wife while he's having surgery. States they don't live close to the pharmacy and it would be helpful if she could get the medications while he is in surgery.

## 2019-03-10 ENCOUNTER — Other Ambulatory Visit: Payer: Self-pay | Admitting: Podiatry

## 2019-03-10 MED ORDER — HYDROCODONE-ACETAMINOPHEN 10-325 MG PO TABS: 1.0000 | ORAL_TABLET | Freq: Four times a day (QID) | ORAL | 0 refills | Status: AC | PRN

## 2019-03-10 NOTE — Telephone Encounter (Signed)
I will send in his prescription now

## 2019-03-11 ENCOUNTER — Encounter: Payer: Self-pay | Admitting: Podiatry

## 2019-03-11 DIAGNOSIS — M722 Plantar fascial fibromatosis: Secondary | ICD-10-CM

## 2019-03-14 ENCOUNTER — Telehealth: Payer: Self-pay | Admitting: *Deleted

## 2019-03-14 NOTE — Telephone Encounter (Signed)
Patient is calling and wanting to know if putting pressure on surgery foot w/  boot on could cause sensations in that foot. His DOS:03/11/2019. Please call.

## 2019-03-14 NOTE — Telephone Encounter (Signed)
It should be fine to put weight on the surgical site with boot on

## 2019-03-20 ENCOUNTER — Encounter: Payer: Self-pay | Admitting: Podiatry

## 2019-03-20 ENCOUNTER — Ambulatory Visit (INDEPENDENT_AMBULATORY_CARE_PROVIDER_SITE_OTHER): Payer: 59 | Admitting: Podiatry

## 2019-03-20 ENCOUNTER — Other Ambulatory Visit: Payer: Self-pay

## 2019-03-20 VITALS — BP 117/84 | HR 75 | Temp 97.1°F | Resp 16

## 2019-03-20 DIAGNOSIS — M722 Plantar fascial fibromatosis: Secondary | ICD-10-CM

## 2019-03-20 NOTE — Progress Notes (Signed)
Subjective:   Patient ID: Dillon Richards, male   DOB: 58 y.o.   MRN: [id number]   HPI Patient presents stating doing pretty well with minimal discomfort and I had no pain the first week and just mild discomfort recently   ROS      Objective:  Physical Exam  Neurovascular status intact negative Bevelyn Buckles' sign is noted with patient's left incision site medial lateral side wound edges coapted well stitches intact with mild plantar pain more in the arch and in the heel     Assessment:  Overall doing well after having endoscopic release left     Plan:  Reviewed anti-inflammatories and I recommended long-term utilization of night splint to stretch the arch and take pressure off the arch area.  Patient is dispensed night splint with instructions on usage and will continue boot usage gradual surgical shoe usage.  Reappoint 2 weeks or earlier if needed

## 2019-04-02 ENCOUNTER — Encounter: Payer: 59 | Admitting: Podiatry

## 2019-04-02 ENCOUNTER — Other Ambulatory Visit: Payer: Self-pay

## 2019-04-02 ENCOUNTER — Ambulatory Visit (INDEPENDENT_AMBULATORY_CARE_PROVIDER_SITE_OTHER): Payer: 59 | Admitting: Podiatry

## 2019-04-02 ENCOUNTER — Encounter: Payer: Self-pay | Admitting: Podiatry

## 2019-04-02 DIAGNOSIS — M722 Plantar fascial fibromatosis: Secondary | ICD-10-CM

## 2019-04-02 NOTE — Progress Notes (Signed)
Subjective:   Patient ID: Dillon Richards, male   DOB: 58 y.o.   MRN: [id number]   HPI Patient states doing well with minimal discomfort noted and states that the heel is doing well   ROS      Objective:  Physical Exam  Neurovascular status intact negative Bevelyn Buckles' sign noted wound edges well coapted left heel     Assessment:  Doing well post endoscopic surgery     Plan:  Stitch removal wound edges coapted well applied ankle compression stocking gave instructions on gradual increase in activity shoe gear usage but continued boot usage splint usage with ice.  Reappoint to recheck as needed

## 2019-05-23 ENCOUNTER — Encounter: Payer: Self-pay | Admitting: Gastroenterology

## 2019-06-09 ENCOUNTER — Encounter: Payer: Self-pay | Admitting: Gastroenterology

## 2019-06-09 ENCOUNTER — Ambulatory Visit (INDEPENDENT_AMBULATORY_CARE_PROVIDER_SITE_OTHER): Payer: 59 | Admitting: Gastroenterology

## 2019-06-09 ENCOUNTER — Other Ambulatory Visit: Payer: Self-pay

## 2019-06-09 VITALS — BP 130/88 | HR 82 | Temp 97.8°F | Ht 74.0 in | Wt 205.2 lb

## 2019-06-09 DIAGNOSIS — Z01818 Encounter for other preprocedural examination: Secondary | ICD-10-CM | POA: Diagnosis not present

## 2019-06-09 DIAGNOSIS — K581 Irritable bowel syndrome with constipation: Secondary | ICD-10-CM

## 2019-06-09 DIAGNOSIS — Z1211 Encounter for screening for malignant neoplasm of colon: Secondary | ICD-10-CM

## 2019-06-09 MED ORDER — CLENPIQ 10-3.5-12 MG-GM -GM/160ML PO SOLN: 1.0000 | Freq: Once | ORAL | 0 refills | Status: AC

## 2019-06-09 NOTE — Progress Notes (Signed)
Chief Complaint:   Referring Provider:  [name], MD      ASSESSMENT AND PLAN;   #1. Colorectal cancer screening. Had FH of polyps (dad)  #2. IBS-C  Plan: -Trial of Mg 500mg  po qd. -I instructed him to call in 2 weeks and let us know how he is doing. -Exercise.  Start walking 30 minutes/day. -Proceed with colonoscopy with 2 day prep (Wednesday). Discussed risks & benefits. (Risks including rare perforation req laparotomy, bleeding after bx/polypectomy req blood transfusion, rarely missing neoplasms, risks of anesthesia/sedation). Benefits outweigh the risks. Patient agrees to proceed. All the questions were answered. Consent forms given for review. -FU in 12 weeks. If still with problems, amitiza or linzess. -Blood work Dr [employer] office.    HPI:    Dillon Richards is a 59 y.o. male   For colorectal cancer screening.  Long standing constipation, incompete evac and takes a long time for him to have a bowel movement.  Has used Metamucil which did not work.  Given a lot of gas.  Has tried Benefiber which was somewhat better but still not good enough.  He does admit that he has been sitting in front of the computer at his job more than walking around.  His mother and daughter has similar problems.  Dad had polyps about age of 67.  He is due for screening colonoscopy.  Has not used any medications for constipation except over-the-counter.  Daughter has been on Linzess which did not work for her.  He does drink plenty of water, drinks orange juice in the morning.  No melena or hematochezia.  No fever chills night sweats or weight loss.     Past GI procedures: Colonoscopy 03/2012 ([facility]): Fair preparation.  Highly redundant colon.  Rpt in 5 years.  SH- Wife is Lattie Haw, 2 children. Past Medical History:  Diagnosis Date  . BPH (benign prostatic hyperplasia)   . Chest pain   . DOE (dyspnea on exertion)   . Sleep apnea    On CPAP machine     Past Surgical  History:  Procedure Laterality Date  . COLONOSCOPY  03/13/2012   Small internal hemorrhoids. Otherwise normal colonoscopy. The colon was highly redundant  . HERNIA REPAIR  june 2013  . SHOULDER ARTHROSCOPY WITH SUBACROMIAL DECOMPRESSION  03/20/2012   Procedure: SHOULDER ARTHROSCOPY WITH SUBACROMIAL DECOMPRESSION;  Surgeon: Magnus Sinning, MD;  Location: [facility];  Service: Orthopedics;  Laterality: Left;  WITH OPEN DISTAL CLAVICLE RESECTION  . TONSILLECTOMY  59 years old    Family History  Problem Relation Age of Onset  . Breast cancer Mother        stage 1 that changed to liver cancer  . Stroke Mother   . Anuerysm Father   . Hypertension Father   . Other Sister        fecal incontinence   . Arrhythmia Brother   . Colon cancer Neg Hx   . Esophageal cancer Neg Hx     Social History   Tobacco Use  . Smoking status: Former Smoker    Packs/day: 0.05    Years: 5.00    Pack years: 0.25    Types: Cigarettes    Quit date: 1970    Years since quitting: 51.1  . Smokeless tobacco: Never Used  . Tobacco comment: smoked as a teenager   Substance Use Topics  . Alcohol use: No  . Drug use: No    Current Outpatient Medications  Medication Sig Dispense Refill  .  ketoconazole (NIZORAL) 2 % shampoo as needed.    . Multiple Vitamin (MULTIVITAMIN) tablet Take 1 tablet by mouth as needed.    Marland Kitchen aspirin 81 MG chewable tablet Chew 81 mg by mouth daily.    . nitroGLYCERIN (NITROSTAT) 0.4 MG SL tablet Place 1 tablet under the tongue as needed.     No current facility-administered medications for this visit.    No Known Allergies  Review of Systems:  Constitutional: Denies fever, chills, diaphoresis, appetite change and fatigue.  HEENT: Denies photophobia, eye pain, redness, hearing loss, ear pain, congestion, sore throat, rhinorrhea, sneezing, mouth sores, neck pain, neck stiffness and tinnitus.   Respiratory: Denies SOB, DOE, cough, chest tightness,  and wheezing.   Cardiovascular:  Denies chest pain, palpitations and leg swelling.  Genitourinary: Denies dysuria, urgency, frequency, hematuria, flank pain and difficulty urinating.  Musculoskeletal: Denies myalgias, back pain, joint swelling, arthralgias and gait problem.  Skin: No rash.  Neurological: Denies dizziness, seizures, syncope, weakness, light-headedness, numbness and headaches.  Hematological: Denies adenopathy. Easy bruising, personal or family bleeding history  Psychiatric/Behavioral: No anxiety or depression     Physical Exam:    BP 130/88   Pulse 82   Temp 97.8 F (36.6 C)   Ht 6\' 2"  (1.88 m)   Wt 205 lb 4 oz (93.1 kg)   BMI 26.35 kg/m  Wt Readings from Last 3 Encounters:  06/09/19 205 lb 4 oz (93.1 kg)  01/01/19 195 lb (88.5 kg)  11/28/18 197 lb (89.4 kg)   Constitutional:  Well-developed, in no acute distress. Psychiatric: Normal mood and affect. Behavior is normal. HEENT: Pupils normal.  Conjunctivae are normal. No scleral icterus. Neck supple.  Cardiovascular: Normal rate, regular rhythm. No edema Pulmonary/chest: Effort normal and breath sounds normal. No wheezing, rales or rhonchi. Abdominal: Soft, nondistended. Nontender. Bowel sounds active throughout. There are no masses palpable. No hepatomegaly. Rectal:  defered Neurological: Alert and oriented to person place and time. Skin: Skin is warm and dry. No rashes noted.  Data Reviewed: I have personally reviewed following labs and imaging studies  CBC: CBC Latest Ref Rng & Units 03/18/2012  WBC 4.0 - 10.5 K/uL 4.1  Hemoglobin 13.0 - 17.0 g/dL 14.3  Hematocrit 39.0 - 52.0 % 41.9  Platelets 150 - 400 K/uL 285     Carmell Austria, MD 06/09/2019, 11:39 AM  Cc: [name], MD

## 2019-06-09 NOTE — Patient Instructions (Addendum)
If you are age 59 or older, your body mass index should be between 23-30. Your Body mass index is 26.35 kg/m. If this is out of the aforementioned range listed, please consider follow up with your Primary Care Provider.  If you are age 74 or younger, your body mass index should be between 19-25. Your Body mass index is 26.35 kg/m. If this is out of the aformentioned range listed, please consider follow up with your Primary Care Provider.   You have been scheduled for a colonoscopy. Please follow written instructions given to you at your visit today.  Please pick up your prep supplies at the pharmacy within the next 1-3 days. If you use inhalers (even only as needed), please bring them with you on the day of your procedure. Your physician has requested that you go to www.startemmi.com and enter the access code given to you at your visit today. This web site gives a general overview about your procedure. However, you should still follow specific instructions given to you by our office regarding your preparation for the procedure.  Please purchase the following medications over the counter and take as directed: Magnesium 500 mg once daily.   Please call Dr. Leland Her nurse Karl Pock, RN)  in 2 weeks at [phone]  to let her now how you are doing.   Two days before your procedure: Mix 3 packs (or capfuls) of Miralax in 48 ounces of clear liquid and drink at 6pm.   Thank you,  Dr. Jackquline Denmark

## 2019-07-23 ENCOUNTER — Encounter: Payer: Self-pay | Admitting: Gastroenterology

## 2019-08-05 ENCOUNTER — Telehealth: Payer: Self-pay | Admitting: Gastroenterology

## 2019-08-05 NOTE — Telephone Encounter (Signed)
ERROR

## 2019-08-05 NOTE — Telephone Encounter (Signed)
Pt  cancelled appt for colon scheduled for tomorrow. Pt  has been taking Magnisium as directed and states someone was to call to see how he was doing and if colon was still necessary.  He is 100% feeling better. Would like to know if colonoscopy is still necessary  If so will reschedule.  Please advise

## 2019-08-06 ENCOUNTER — Encounter: Payer: 59 | Admitting: Gastroenterology

## 2019-08-06 NOTE — Telephone Encounter (Signed)
I have called patient and left a message for patient to return my call.

## 2019-08-06 NOTE — Telephone Encounter (Signed)
I have instructed patient per Dr. Lyndel Safe that he would still need to proceed with the colonoscopy. Patient voiced understanding and said he would call back to schedule.

## 2019-09-04 ENCOUNTER — Telehealth: Payer: Self-pay | Admitting: Gastroenterology

## 2019-09-04 NOTE — Telephone Encounter (Signed)
Patient called states pharmacy has not received prep medication for him please advise and call patient once done

## 2019-09-05 NOTE — Telephone Encounter (Signed)
Prescription re-sent to pharmacy.

## 2019-09-11 ENCOUNTER — Ambulatory Visit (AMBULATORY_SURGERY_CENTER): Payer: 59 | Admitting: Gastroenterology

## 2019-09-11 ENCOUNTER — Other Ambulatory Visit: Payer: Self-pay

## 2019-09-11 ENCOUNTER — Encounter: Payer: Self-pay | Admitting: Gastroenterology

## 2019-09-11 VITALS — BP 138/92 | HR 61 | Temp 97.3°F | Resp 17 | Ht 74.0 in | Wt 205.0 lb

## 2019-09-11 DIAGNOSIS — D120 Benign neoplasm of cecum: Secondary | ICD-10-CM

## 2019-09-11 DIAGNOSIS — Z1211 Encounter for screening for malignant neoplasm of colon: Secondary | ICD-10-CM | POA: Diagnosis not present

## 2019-09-11 HISTORY — PX: COLONOSCOPY: SHX174

## 2019-09-11 MED ORDER — SODIUM CHLORIDE 0.9 % IV SOLN: 500.0000 mL | Freq: Once | INTRAVENOUS | Status: DC

## 2019-09-11 NOTE — Patient Instructions (Signed)
Thank you for allowing [place] to care for you today!  Await pathology results by mail, approximately 7-10 days.  Will make recommendation for next colonoscopy at that time.  Resume previous diet and medications.  Recommend adopting a high fiber diet.  Return to your normal activities tomorrow.  YOU HAD AN ENDOSCOPIC PROCEDURE TODAY AT [facility]:   Refer to the procedure report that was given to you for any specific questions about what was found during the examination.  If the procedure report does not answer your questions, please call your gastroenterologist to clarify.  If you requested that your care partner not be given the details of your procedure findings, then the procedure report has been included in a sealed envelope for you to review at your convenience later.  YOU SHOULD EXPECT: Some feelings of bloating in the abdomen. Passage of more gas than usual.  Walking can help get rid of the air that was put into your GI tract during the procedure and reduce the bloating. If you had a lower endoscopy (such as a colonoscopy or flexible sigmoidoscopy) you may notice spotting of blood in your stool or on the toilet paper. If you underwent a bowel prep for your procedure, you may not have a normal bowel movement for a few days.  Please Note:  You might notice some irritation and congestion in your nose or some drainage.  This is from the oxygen used during your procedure.  There is no need for concern and it should clear up in a day or so.  SYMPTOMS TO REPORT IMMEDIATELY:   Following lower endoscopy (colonoscopy or flexible sigmoidoscopy):  Excessive amounts of blood in the stool  Significant tenderness or worsening of abdominal pains  Swelling of the abdomen that is new, acute  Fever of 100F or higher   Following upper endoscopy (EGD)  Vomiting of blood or coffee ground material  New chest pain or pain under the shoulder blades  Painful or persistently difficult  swallowing  New shortness of breath  Fever of 100F or higher  Black, tarry-looking stools  For urgent or emergent issues, a gastroenterologist can be reached at any hour by calling [phone]. Do not use MyChart messaging for urgent concerns.    DIET:  We do recommend a small meal at first, but then you may proceed to your regular diet.  Drink plenty of fluids but you should avoid alcoholic beverages for 24 hours.  ACTIVITY:  You should plan to take it easy for the rest of today and you should NOT DRIVE or use heavy machinery until tomorrow (because of the sedation medicines used during the test).    FOLLOW UP: Our staff will call the number listed on your records 48-72 hours following your procedure to check on you and address any questions or concerns that you may have regarding the information given to you following your procedure. If we do not reach you, we will leave a message.  We will attempt to reach you two times.  During this call, we will ask if you have developed any symptoms of COVID 19. If you develop any symptoms (ie: fever, flu-like symptoms, shortness of breath, cough etc.) before then, please call [phone].  If you test positive for Covid 19 in the 2 weeks post procedure, please call and report this information to [place].    If any biopsies were taken you will be contacted by phone or by letter within the next 1-3 weeks.  Please  call us at [phone] if you have not heard about the biopsies in 3 weeks.    SIGNATURES/CONFIDENTIALITY: You and/or your care partner have signed paperwork which will be entered into your electronic medical record.  These signatures attest to the fact that that the information above on your After Visit Summary has been reviewed and is understood.  Full responsibility of the confidentiality of this discharge information lies with you and/or your care-partner.

## 2019-09-11 NOTE — Progress Notes (Signed)
Called to room to assist during endoscopic procedure.  Patient ID and intended procedure confirmed with present staff. Received instructions for my participation in the procedure from the performing physician.  

## 2019-09-11 NOTE — Op Note (Signed)
[facility] Patient Name: Dillon Richards Procedure Date: 09/11/2019 9:31 AM MRN: [id number] Endoscopist: Jackquline Denmark , MD Age: 58 Referring MD:  Date of Birth: [birth year] Gender: Male Account #: [id number] Procedure:                Colonoscopy Indications:              Screening for colorectal malignant neoplasm Medicines:                Monitored Anesthesia Care Procedure:                Pre-Anesthesia Assessment:                           - Prior to the procedure, a History and Physical                            was performed, and patient medications and                            allergies were reviewed. The patient's tolerance of                            previous anesthesia was also reviewed. The risks                            and benefits of the procedure and the sedation                            options and risks were discussed with the patient.                            All questions were answered, and informed consent                            was obtained. Prior Anticoagulants: The patient has                            taken no previous anticoagulant or antiplatelet                            agents. ASA Grade Assessment: II - A patient with                            mild systemic disease. After reviewing the risks                            and benefits, the patient was deemed in                            satisfactory condition to undergo the procedure.                           After obtaining informed consent, the colonoscope  was passed under direct vision. Throughout the                            procedure, the patient's blood pressure, pulse, and                            oxygen saturations were monitored continuously. The                            Colonoscope was introduced through the anus and                            advanced to the 2 cm into the ileum. The                            colonoscopy was performed  without difficulty. The                            patient tolerated the procedure well. The quality                            of the bowel preparation was good. The terminal                            ileum, ileocecal valve, appendiceal orifice, and                            rectum were photographed. Scope In: 9:46:03 AM Scope Out: 10:00:31 AM Total Procedure Duration: 0 hours 14 minutes 28 seconds  Findings:                 A 2 mm polyp was found in the cecum. The polyp was                            sessile. The polyp was removed with a cold biopsy                            forceps. Resection and retrieval were complete.                           A few rare small-mouthed diverticula were found in                            the sigmoid colon and ascending colon.                           Non-bleeding internal hemorrhoids were found during                            retroflexion. The hemorrhoids were small.                           The terminal ileum appeared normal.  The exam was otherwise without abnormality on                            direct and retroflexion views. Colon was highly                            redundant. Complications:            No immediate complications. Estimated Blood Loss:     Estimated blood loss: none. Impression:               -Diminutive colonic polyp s/p polypectomy.                           -Minimal colonic diverticulosis.                           -Otherwise normal colonoscopy to TI. Colon was                            highly redundant. Recommendation:           - Patient has a contact number available for                            emergencies. The signs and symptoms of potential                            delayed complications were discussed with the                            patient. Return to normal activities tomorrow.                            Written discharge instructions were provided to the                             patient.                           - High fiber diet.                           - Continue present medications.                           - Await pathology results.                           - Repeat colonoscopy in 7-10 years for surveillance                            based on pathology results.                           - Return to GI clinic PRN.                           -  The findings and recommendations were discussed                            with Lattie Haw. Jackquline Denmark, MD 09/11/2019 10:10:18 AM This report has been signed electronically.

## 2019-09-11 NOTE — Progress Notes (Signed)
Pt's states no medical or surgical changes since previsit or office visit.  Vitals DT 

## 2019-09-15 ENCOUNTER — Telehealth: Payer: Self-pay

## 2019-09-15 ENCOUNTER — Telehealth: Payer: Self-pay | Admitting: *Deleted

## 2019-09-15 NOTE — Telephone Encounter (Signed)
Second attempt follow up call to pt, lm on vm. 

## 2019-09-15 NOTE — Telephone Encounter (Signed)
First attempt, left VM.  

## 2019-09-19 ENCOUNTER — Encounter: Payer: Self-pay | Admitting: Gastroenterology

## 2020-08-06 ENCOUNTER — Encounter: Payer: Self-pay | Admitting: Cardiology

## 2020-08-06 ENCOUNTER — Ambulatory Visit: Payer: 59 | Admitting: Cardiology

## 2020-08-06 ENCOUNTER — Other Ambulatory Visit: Payer: Self-pay

## 2020-08-06 VITALS — BP 139/95 | HR 81 | Temp 98.2°F | Wt 205.0 lb

## 2020-08-06 DIAGNOSIS — I872 Venous insufficiency (chronic) (peripheral): Secondary | ICD-10-CM | POA: Insufficient documentation

## 2020-08-06 DIAGNOSIS — R0600 Dyspnea, unspecified: Secondary | ICD-10-CM

## 2020-08-06 DIAGNOSIS — R0609 Other forms of dyspnea: Secondary | ICD-10-CM

## 2020-08-06 DIAGNOSIS — R079 Chest pain, unspecified: Secondary | ICD-10-CM

## 2020-08-06 MED ORDER — METOPROLOL TARTRATE 50 MG PO TABS: 50.0000 mg | ORAL_TABLET | ORAL | 1 refills | Status: DC

## 2020-08-06 NOTE — Progress Notes (Signed)
Patient referred by Dillon Richards., MD for chest pressure, dyspnea on exertion.  Subjective:   Dillon Richards, male    DOB: September 09, 1960, 60 y.o.   MRN: [id number]     Chief Complaint  Patient presents with  . Shortness of Breath  . Follow-up     HPI  60 y.o. Caucasian male with chest pain, shortness of breath  Patient underwent exercise nuclear stress test in 2020, which was completely normal. However, recently, he has noticed retrosternal chest pain and shortness of breath with climbing 2 flights of stairs, or while push mowing. Separately, he has noticed right leg burning sensation.   Initial consultation 2020: Patient underwent exercise nuclear stress tests.  He walked for 30 minutes and achieved 17 METS without any chest pain, shortness of breath or EKG abnormality.  SPECT imaging showed decrease tracer uptake in inferior myocardium at rest with reasonably normal perfusion at stress.  This was thought to be related to tissue alternation artifact.  Patient has not had any episodes of chest pain since then.  He walks for an hour at 15 minute per mile pace without any difficulty.  However, he is very concerned that on heart Dillon Richards days, he notices shortness of breath on after walking.  He suspects that his recent improvement in symptoms is due to colder weather.  He remains very concerned about his symptoms.   Current Outpatient Medications on File Prior to Visit  Medication Sig Dispense Refill  . aspirin 81 MG chewable tablet Chew 81 mg by mouth daily.    . finasteride (PROSCAR) 5 MG tablet Take 5 mg by mouth daily.    Marland Kitchen ketoconazole (NIZORAL) 2 % shampoo as needed.    . Magnesium 400 MG CAPS Take by mouth.    . Multiple Vitamin (MULTIVITAMIN) tablet Take 1 tablet by mouth as needed.    . nitroGLYCERIN (NITROSTAT) 0.4 MG SL tablet Place 1 tablet under the tongue as needed.    . minoxidil (LONITEN) 10 MG tablet Take 5 mg by mouth daily.     No current  facility-administered medications on file prior to visit.    Cardiovascular studies:  EKG 08/06/2020: Sinus rhythm 82 bpm Left atrial enlargement Poor R-wave progression  Exercise Sestamibi stress test 12/23/2018: Exercise nuclear stress test was performed using Bruce protocol. Patient reached 17 METS and 96% 60 y.o. age predicted maximum heart rate. Exercise capacity is excellent. No chest pain reported. Heart rate and hemodynamic response is normal. Stress EKG is negative for ischemia. SPECT stress images demonstrate normal myocardial perfusion. Decreased tracer uptake in inferior myocardium at rest likely due to attenuation artifact. Stress LVEF 61%. Low risk study.  EKG 11/08/2018: Sinus rhythm. Normal EKG.  Recent labs: 11/29/2018: H/H 14/43. MCV 90. Platelets 304 Chol 179, TG 64, HDL 43, LDL 123.  TSH 1.5. Normal.  Review of Systems  Cardiovascular: Positive for chest pain and dyspnea on exertion. Negative for leg swelling, palpitations and syncope.    Today's Vitals   08/06/20 1159  BP: (!) 139/95  Pulse: 81  Temp: 98.2 F (36.8 C)  SpO2: 96%  Weight: 205 lb (93 kg)   Body mass index is 26.32 kg/m.   Objective:    Physical Exam Vitals and nursing note reviewed.  Constitutional:      General: He is not in acute distress. Neck:     Vascular: No JVD.  Cardiovascular:     Rate and Rhythm: Normal rate and regular rhythm.     Heart sounds:  Normal heart sounds. No murmur heard.     Comments: Prominent varicosities RLL Pulmonary:     Effort: Pulmonary effort is normal.     Breath sounds: Normal breath sounds. No wheezing or rales.  Musculoskeletal:     Right lower leg: No edema.     Left lower leg: No edema.        Assessment & Recommendations:   60 y.o. Caucasian male with chest pain, shortness of breath  Chest pain and dyspnea on exertion: Previously normal exercise nuclear stress test (2020). He still has exertional angina, dyspnea symptoms. Recommend  echocardiogram, CCTA. Prescribed SL NTG for chest pain, and metoprolol for use before CCTA  Venous insufficiency: RLL varicocities. No DVT on recent study. Recommend leg elevation, compression stockings 15-20 mmHg.   F/u after tests  [name], MD [facility] Pager: [phone] Office: [phone] If no answer Cell [phone]

## 2020-08-13 ENCOUNTER — Other Ambulatory Visit: Payer: Self-pay

## 2020-08-13 DIAGNOSIS — R079 Chest pain, unspecified: Secondary | ICD-10-CM

## 2020-08-17 ENCOUNTER — Ambulatory Visit: Payer: 59

## 2020-08-17 ENCOUNTER — Other Ambulatory Visit: Payer: Self-pay

## 2020-08-17 DIAGNOSIS — R0609 Other forms of dyspnea: Secondary | ICD-10-CM

## 2020-08-17 DIAGNOSIS — R0600 Dyspnea, unspecified: Secondary | ICD-10-CM

## 2020-08-17 DIAGNOSIS — R079 Chest pain, unspecified: Secondary | ICD-10-CM

## 2020-08-23 ENCOUNTER — Other Ambulatory Visit: Payer: Self-pay | Admitting: Cardiology

## 2020-08-23 ENCOUNTER — Telehealth: Payer: Self-pay | Admitting: Cardiology

## 2020-08-23 DIAGNOSIS — R0600 Dyspnea, unspecified: Secondary | ICD-10-CM

## 2020-08-23 DIAGNOSIS — R0609 Other forms of dyspnea: Secondary | ICD-10-CM

## 2020-08-23 DIAGNOSIS — R079 Chest pain, unspecified: Secondary | ICD-10-CM

## 2020-08-23 NOTE — Progress Notes (Signed)
Pt spoke to Dr. Virgina Jock

## 2020-08-23 NOTE — Telephone Encounter (Signed)
Holding off CCTA given contrast shortage. Normal exercise nuclear stress test 12/2018. Will obtain cardiopulmonary exercise stress test instead, for further evaluation.   [name], MD

## 2020-08-23 NOTE — Progress Notes (Signed)
Can you find out if it is scheduled? There is worldwide shortage of IV contrast. We could consider holding off for now, office f/u in 3 months. If he still has symptoms, we could then order it. If not scheduled yet, would recommend canceling it.  Thanks MJP

## 2020-08-23 NOTE — Progress Notes (Signed)
Called pt to inform him about his echo results, pt would like to know if he still needs to do a CT scan of his heart. Please advise.

## 2020-08-25 ENCOUNTER — Ambulatory Visit: Payer: 59 | Admitting: Cardiology

## 2020-09-10 ENCOUNTER — Ambulatory Visit: Payer: 59 | Admitting: Cardiology

## 2020-10-28 ENCOUNTER — Ambulatory Visit (HOSPITAL_COMMUNITY): Payer: 59 | Attending: Family Medicine

## 2020-10-28 DIAGNOSIS — R079 Chest pain, unspecified: Secondary | ICD-10-CM | POA: Diagnosis present

## 2020-10-28 DIAGNOSIS — R0600 Dyspnea, unspecified: Secondary | ICD-10-CM

## 2020-10-28 DIAGNOSIS — R0609 Other forms of dyspnea: Secondary | ICD-10-CM

## 2020-11-03 ENCOUNTER — Ambulatory Visit: Payer: 59 | Admitting: Cardiology

## 2020-11-03 ENCOUNTER — Encounter: Payer: Self-pay | Admitting: Cardiology

## 2020-11-03 ENCOUNTER — Other Ambulatory Visit: Payer: Self-pay

## 2020-11-03 VITALS — BP 124/77 | HR 70 | Temp 98.6°F | Resp 16 | Ht 74.0 in | Wt 202.0 lb

## 2020-11-03 DIAGNOSIS — R0600 Dyspnea, unspecified: Secondary | ICD-10-CM

## 2020-11-03 DIAGNOSIS — R079 Chest pain, unspecified: Secondary | ICD-10-CM

## 2020-11-03 DIAGNOSIS — R0609 Other forms of dyspnea: Secondary | ICD-10-CM

## 2020-11-03 NOTE — Progress Notes (Signed)
Patient referred by [name], MD for chest pressure, dyspnea on exertion.  Subjective:   Dillon Richards, male    DOB: 02/14/1961, 60 y.o.   MRN: [id number]     Chief Complaint  Patient presents with   Exertional dyspnea   Results   Follow-up     HPI  60 y.o. Caucasian male with chest pain, shortness of breath  Patient continues to have unpredictable, and unexpected episodes of dyspnea with exertion.  Patient recently underwent echocardiogram and cardiopulmonary exercise stress testing, details below.   Initial consultation 2020: Patient underwent exercise nuclear stress tests.  He walked for 30 minutes and achieved 17 METS without any chest pain, shortness of breath or EKG abnormality.  SPECT imaging showed decrease tracer uptake in inferior myocardium at rest with reasonably normal perfusion at stress.  This was thought to be related to tissue alternation artifact.  Patient has not had any episodes of chest pain since then.  He walks for an hour at 15 minute per mile pace without any difficulty.  However, he is very concerned that on heart [place] days, he notices shortness of breath on after walking.  He suspects that his recent improvement in symptoms is due to colder weather.  He remains very concerned about his symptoms.   Current Outpatient Medications on File Prior to Visit  Medication Sig Dispense Refill   aspirin 81 MG chewable tablet Chew 81 mg by mouth daily.     finasteride (PROSCAR) 5 MG tablet Take 5 mg by mouth daily.     ketoconazole (NIZORAL) 2 % shampoo as needed.     Magnesium 400 MG CAPS Take by mouth.     metoprolol tartrate (LOPRESSOR) 50 MG tablet Take 1 tablet (50 mg total) by mouth as directed. Use 1 tablet 2 hrs before CT scan 6 tablet 1   minoxidil (LONITEN) 10 MG tablet Take 5 mg by mouth daily.     Multiple Vitamin (MULTIVITAMIN) tablet Take 1 tablet by mouth as needed.     nitroGLYCERIN (NITROSTAT) 0.4 MG SL tablet Place 1 tablet under  the tongue as needed.     No current facility-administered medications on file prior to visit.    Cardiovascular studies:  CPEX 10/28/2020: Normal exercise capacity without any evidence of cardiac limitation. The high PETCO2 and low VE/VCO2 are suggestive of hypoventilation.   Echocardiogram 08/17/2020:  Left ventricle cavity is normal in size and ventricular wall thickness.  Normal global wall motion. Normal LV systolic function with EF 71%. Normal  diastolic filling pattern.  No significant valvular abnormality.  Normal right atrial pressure.  EKG 08/06/2020: Sinus rhythm 82 bpm Left atrial enlargement Poor R-wave progression  Exercise Sestamibi stress test 12/23/2018: Exercise nuclear stress test was performed using Bruce protocol. Patient reached 17 METS and 96% age predicted maximum heart rate. Exercise capacity is excellent. No chest pain reported. Heart rate and hemodynamic response is normal. Stress EKG is negative for ischemia. SPECT stress images demonstrate normal myocardial perfusion. Decreased tracer uptake in inferior myocardium at rest likely due to attenuation artifact. Stress LVEF 61%. Low risk study.  EKG 11/08/2018: Sinus rhythm. Normal EKG.  Recent labs: 11/29/2018: H/H 14/43. MCV 90. Platelets 304 Chol 179, TG 64, HDL 43, LDL 123.  TSH 1.5. Normal.  Review of Systems  Cardiovascular:  Positive for dyspnea on exertion. Negative for chest pain, leg swelling, palpitations and syncope.   Today's Vitals   11/03/20 1104  BP: 124/77  Pulse: 70  Resp:  16  Temp: 98.6 F (37 C)  TempSrc: Temporal  SpO2: 96%  Weight: 202 lb (91.6 kg)  Height: '6\' 2"'$  (1.88 m)   Body mass index is 25.94 kg/m.   Objective:    Physical Exam Vitals and nursing note reviewed.  Constitutional:      General: He is not in acute distress. Neck:     Vascular: No JVD.  Cardiovascular:     Rate and Rhythm: Normal rate and regular rhythm.     Heart sounds: Normal heart sounds. No  murmur heard. Pulmonary:     Effort: Pulmonary effort is normal.     Breath sounds: Normal breath sounds. No wheezing or rales.  Musculoskeletal:     Right lower leg: No edema.     Left lower leg: No edema.       Assessment & Recommendations:   60 y.o. Caucasian male with chest pain, shortness of breath  Chest pain and dyspnea on exertion: Previously normal exercise nuclear stress test (2020).  Normal echocardiogram (08/2020 ) Cardiopulmonary exercise testing with no cardiac limitation.   Only salient finding on this testing was mild hypoventilation.  Consider pulmonary evaluation.  Do not recommend any further cardiac testing at this time.    F/u as needed   [name], MD [facility] [place] Pager: [phone] Office: [phone] If no answer Cell [phone]

## 2020-11-03 NOTE — Progress Notes (Deleted)
Patient referred by [name], MD for chest pressure, dyspnea on exertion.  Subjective:   Dillon Richards, male    DOB: [birth year], 60 y.o.   MRN: [id number]     No chief complaint on file.  HPI  60 y.o. Caucasian male with history of exertional dyspnea,exertional chest pain, and venous insufficiency presents for follow up of echocardiogram on 08/17/2020 and cardiopulmonary exercise test on 10/28/2020.   CCTA ? ***  ***   Initial consultation 2020: Patient underwent exercise nuclear stress tests.  He walked for 30 minutes and achieved 17 METS without any chest pain, shortness of breath or EKG abnormality.  SPECT imaging showed decrease tracer uptake in inferior myocardium at rest with reasonably normal perfusion at stress.  This was thought to be related to tissue alternation artifact.  Patient has not had any episodes of chest pain since then.  He walks for an hour at 15 minute per mile pace without any difficulty.  However, he is very concerned that on heart [place] days, he notices shortness of breath on after walking.  He suspects that his recent improvement in symptoms is due to colder weather.  He remains very concerned about his symptoms.   Current Outpatient Medications on File Prior to Visit  Medication Sig Dispense Refill   aspirin 81 MG chewable tablet Chew 81 mg by mouth daily.     finasteride (PROSCAR) 5 MG tablet Take 5 mg by mouth daily.     ketoconazole (NIZORAL) 2 % shampoo as needed.     Magnesium 400 MG CAPS Take by mouth.     metoprolol tartrate (LOPRESSOR) 50 MG tablet Take 1 tablet (50 mg total) by mouth as directed. Use 1 tablet 2 hrs before CT scan 6 tablet 1   minoxidil (LONITEN) 10 MG tablet Take 5 mg by mouth daily.     nitroGLYCERIN (NITROSTAT) 0.4 MG SL tablet Place 1 tablet under the tongue as needed.     No current facility-administered medications on file prior to visit.    Cardiovascular studies:  Cardiopulmonary Exercise Test  10/28/2020: Exercise testing with gas exchange demonstrates normal functional capacity when compared to matched sedentary norms. There is no cardiopulmonary limitation, however the VE/VCO2 is low and the PETCO2 is elevated with diagnosis of OSA, presenting as Obesity Hypoventilation syndrome (OHS), however patient does not meet all criteria to suggest OHS.   Echo 08/17/2020: Left ventricle cavity is normal in size and ventricular wall thickness.  Normal global wall motion. Normal LV systolic function with EF 71%. Normal  diastolic filling pattern.  No significant valvular abnormality.  Normal right atrial pressure.  EKG 08/06/2020: Sinus rhythm 82 bpm Left atrial enlargement Poor R-wave progression  Exercise Sestamibi stress test 12/23/2018: Exercise nuclear stress test was performed using Bruce protocol. Patient reached 17 METS and 96% age predicted maximum heart rate. Exercise capacity is excellent. No chest pain reported. Heart rate and hemodynamic response is normal. Stress EKG is negative for ischemia. SPECT stress images demonstrate normal myocardial perfusion. Decreased tracer uptake in inferior myocardium at rest likely due to attenuation artifact. Stress LVEF 61%. Low risk study.  EKG 11/08/2018: Sinus rhythm. Normal EKG.  Recent labs: 11/29/2018: H/H 14/43. MCV 90. Platelets 304 Chol 179, TG 64, HDL 43, LDL 123.  TSH 1.5. Normal.  ROS  There were no vitals filed for this visit.  There is no height or weight on file to calculate BMI.   Objective:    Physical Exam Cardiovascular:  Heart sounds: Normal heart sounds. No murmur heard.    Comments: Prominent varicosities RLL Musculoskeletal:     Right lower leg: No edema.     Left lower leg: No edema.       Assessment & Recommendations:   60 y.o. Caucasian male with history of exertional dyspnea, exertional chest pain, and venous insufficiency presents for follow up of testing.   Chest pain and dyspnea on  exertion: Previously normal exercise nuclear stress test (2020). He still has exertional angina, dyspnea symptoms. Recommend echocardiogram, CCTA. Prescribed SL NTG for chest pain, and metoprolol for use before CCTA  Venous insufficiency: RLL varicocities. No DVT on recent study. Recommend leg elevation, compression stockings 15-20 mmHg.   F/u ***  [place], MD [facility] [place] Pager: [phone] Office: [phone] If no answer Cell [phone]

## 2020-12-02 ENCOUNTER — Other Ambulatory Visit
Admission: RE | Admit: 2020-12-02 | Discharge: 2020-12-02 | Disposition: A | Discharge status: Home / Self Care | Payer: 59 | Source: Ambulatory Visit | Attending: Pulmonary Disease | Admitting: Pulmonary Disease

## 2020-12-02 DIAGNOSIS — R0609 Other forms of dyspnea: Secondary | ICD-10-CM | POA: Diagnosis present

## 2020-12-02 LAB — D-DIMER, QUANTITATIVE: D-Dimer, Quant: 0.78 ug/mL-FEU — ABNORMAL HIGH (ref 0.00–0.50)

## 2020-12-03 ENCOUNTER — Other Ambulatory Visit: Payer: Self-pay | Admitting: Pulmonary Disease

## 2020-12-03 DIAGNOSIS — R7989 Other specified abnormal findings of blood chemistry: Secondary | ICD-10-CM

## 2020-12-03 DIAGNOSIS — R0609 Other forms of dyspnea: Secondary | ICD-10-CM

## 2020-12-10 ENCOUNTER — Other Ambulatory Visit: Payer: Self-pay

## 2020-12-10 ENCOUNTER — Ambulatory Visit
Admission: RE | Admit: 2020-12-10 | Discharge: 2020-12-10 | Disposition: A | Discharge status: Home / Self Care | Payer: 59 | Source: Ambulatory Visit | Attending: Pulmonary Disease | Admitting: Pulmonary Disease

## 2020-12-10 DIAGNOSIS — R7989 Other specified abnormal findings of blood chemistry: Secondary | ICD-10-CM | POA: Insufficient documentation

## 2020-12-10 DIAGNOSIS — R0609 Other forms of dyspnea: Secondary | ICD-10-CM

## 2020-12-10 DIAGNOSIS — R0600 Dyspnea, unspecified: Secondary | ICD-10-CM | POA: Diagnosis not present

## 2020-12-10 MED ORDER — IOHEXOL 350 MG/ML SOLN
80.0000 mL | Freq: Once | INTRAVENOUS | Status: AC | PRN
  Administered 2020-12-10: 80 mL via INTRAVENOUS

## 2020-12-31 ENCOUNTER — Other Ambulatory Visit: Payer: Self-pay | Admitting: Pulmonary Disease

## 2020-12-31 ENCOUNTER — Other Ambulatory Visit (HOSPITAL_BASED_OUTPATIENT_CLINIC_OR_DEPARTMENT_OTHER): Payer: Self-pay | Admitting: Pulmonary Disease

## 2020-12-31 DIAGNOSIS — R911 Solitary pulmonary nodule: Secondary | ICD-10-CM

## 2021-01-13 ENCOUNTER — Ambulatory Visit: Admission: RE | Admit: 2021-01-13 | Payer: 59 | Source: Ambulatory Visit

## 2021-01-20 ENCOUNTER — Ambulatory Visit: Payer: 59

## 2021-02-03 ENCOUNTER — Ambulatory Visit
Admission: RE | Admit: 2021-02-03 | Discharge: 2021-02-03 | Disposition: A | Discharge status: Home / Self Care | Payer: 59 | Source: Ambulatory Visit | Attending: Pulmonary Disease | Admitting: Pulmonary Disease

## 2021-02-03 ENCOUNTER — Other Ambulatory Visit: Payer: Self-pay

## 2021-02-03 DIAGNOSIS — R911 Solitary pulmonary nodule: Secondary | ICD-10-CM | POA: Diagnosis present

## 2021-02-03 DIAGNOSIS — Z131 Encounter for screening for diabetes mellitus: Secondary | ICD-10-CM | POA: Insufficient documentation

## 2021-02-03 LAB — GLUCOSE, CAPILLARY: Glucose-Capillary: 90 mg/dL (ref 70–99)

## 2021-02-03 MED ORDER — FLUDEOXYGLUCOSE F - 18 (FDG) INJECTION
10.5000 | Freq: Once | INTRAVENOUS | Status: AC
  Administered 2021-02-03: 10.81 via INTRAVENOUS

## 2021-03-31 ENCOUNTER — Other Ambulatory Visit: Payer: Self-pay | Admitting: Sports Medicine

## 2021-03-31 DIAGNOSIS — M25562 Pain in left knee: Secondary | ICD-10-CM

## 2022-10-04 IMAGING — PT NM PET TUM IMG INITIAL (PI) SKULL BASE T - THIGH
9 series · 24 of 25 positions shown · non-contrast
Comparison: Chest CTA 12/10/2020.

CLINICAL DATA: Initial treatment strategy for pulmonary nodule. No
history of malignancy.

EXAM:
NUCLEAR MEDICINE PET SKULL BASE TO THIGH
TECHNIQUE: 10.8 mCi F-18 FDG was injected intravenously. Full-ring PET imaging
was performed from the skull base to thigh after the radiotracer. CT
data was obtained and used for attenuation correction and anatomic
localization.
Fasting blood glucose: 90 mg/dl

[Series 3: ct wb 5.0 b30f · axial · 5.0mm · 0.98mm/px · z∈[-314,+670]mm · 3 of 329 slices shown]
[im 1/329]
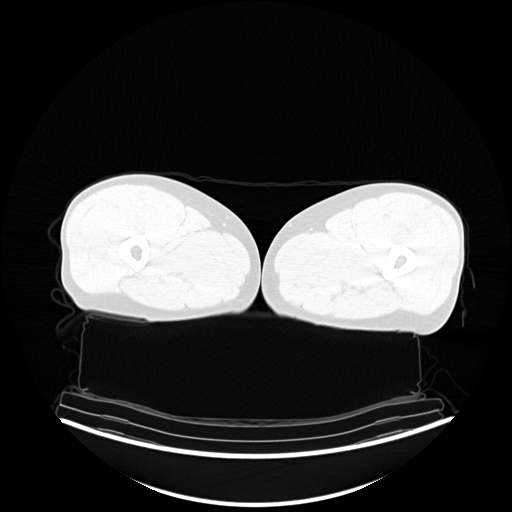
[im 165/329]
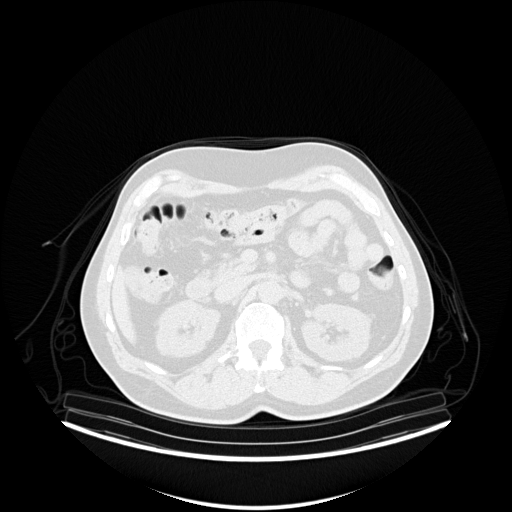
[im 329/329  brain]
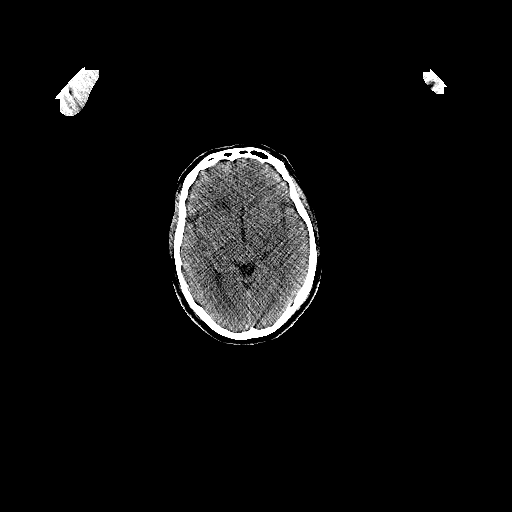

[Series 5: pet wb uncorrected (nac) · axial · 5.0mm · 4.07mm/px · z∈[-314,+670]mm · 3 of 329 slices shown]
[im 1/329]
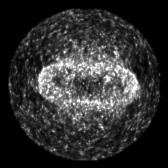
[im 165/329]
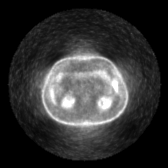
[im 329/329]
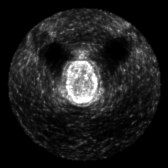

[Series 6: pet wb (ac) · axial · 5.0mm · 2.91mm/px · z∈[-314,+670]mm · 4 of 329 slices shown]
[im 1/329]
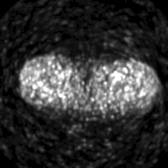
[im 110/329]
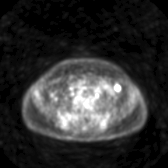
[im 219/329]
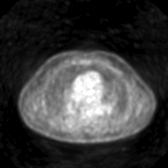
[im 329/329]
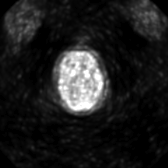

[Series 603: pet axial fused · 3 of 327 slices shown]
[im 1/327]
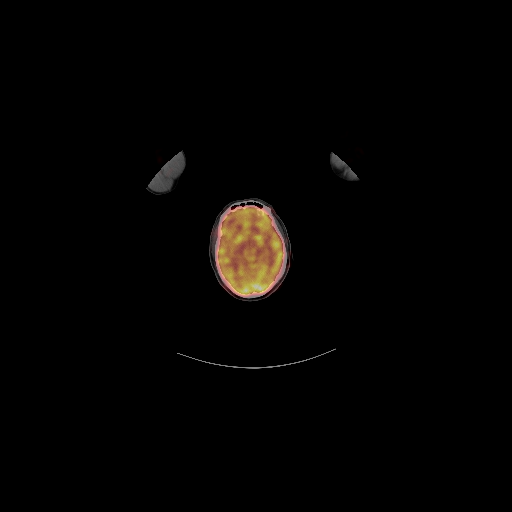
[im 109/327]
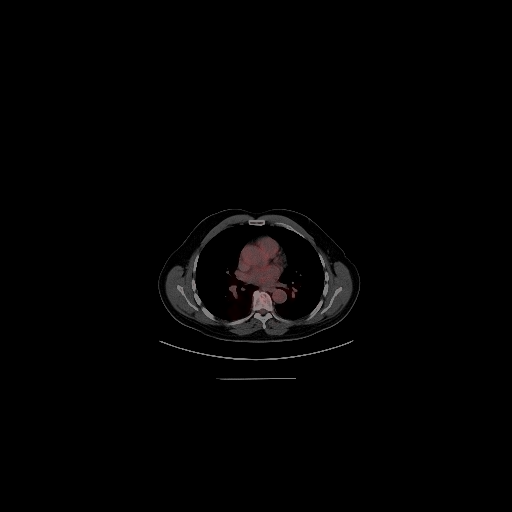
[im 327/327]
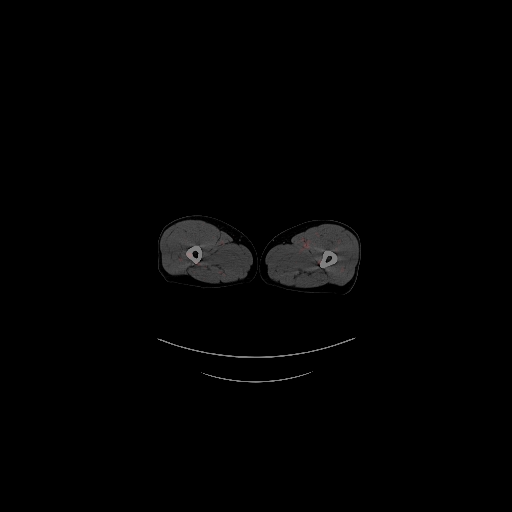

[Series 604: pet coronal fused · 1 of 118 slices shown]
[im 1/118]
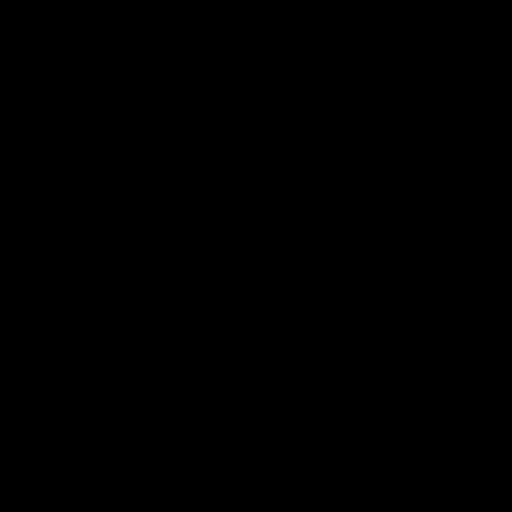

[Series 605: pet sagittal fused · 2 of 139 slices shown]
[im 1/139]
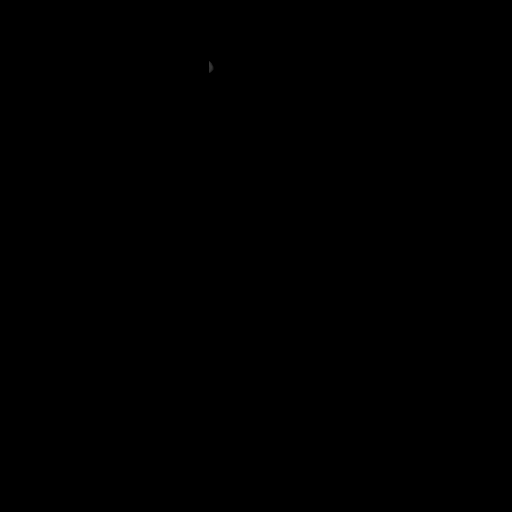
[im 139/139]
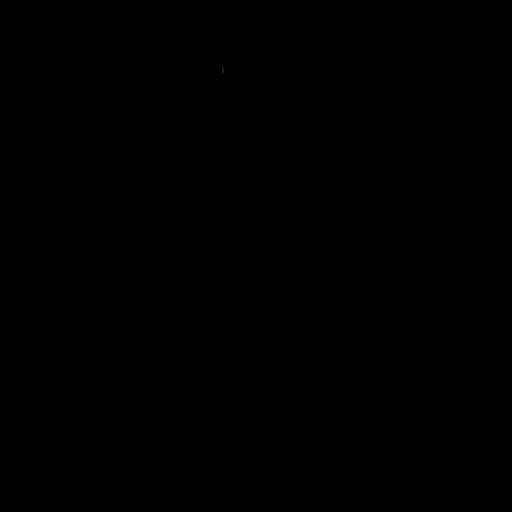

[Series 606: pet axial · 4 of 327 slices shown]
[im 1/327]
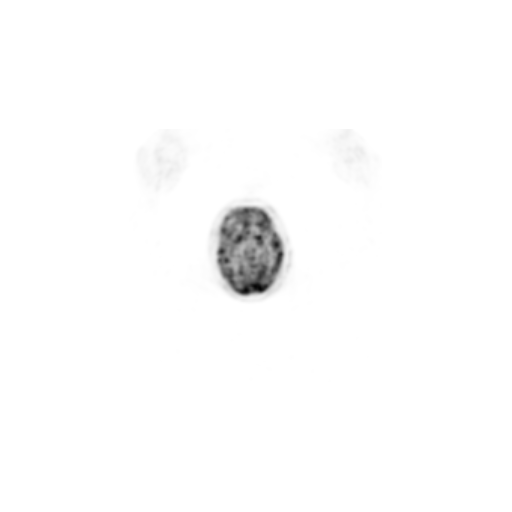
[im 109/327]
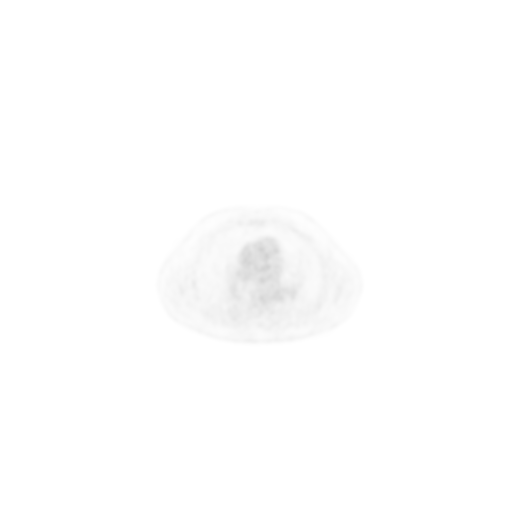
[im 218/327]
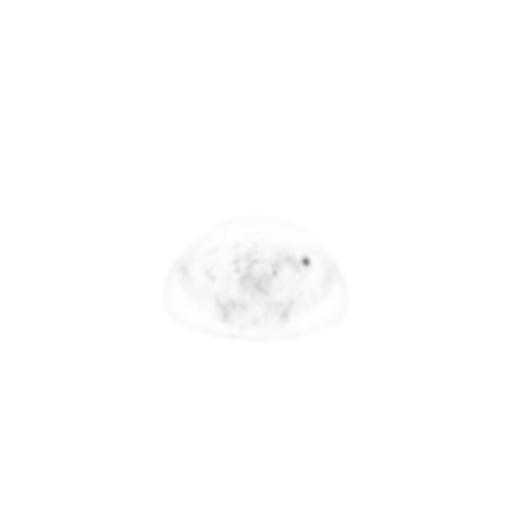
[im 327/327]
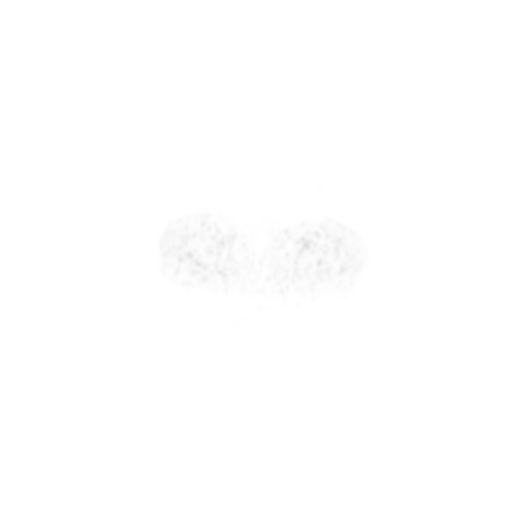

[Series 607: pet coronal · 2 of 142 slices shown]
[im 1/142]
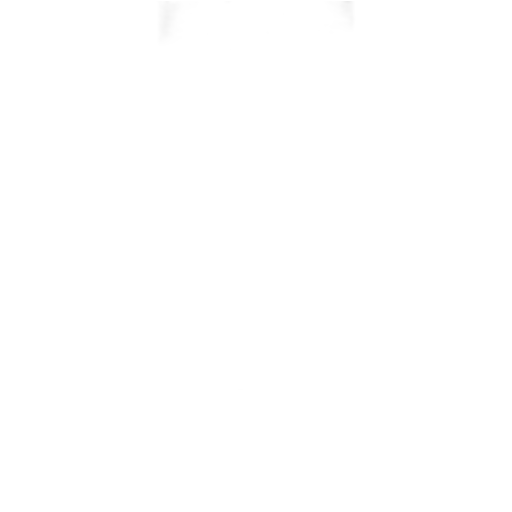
[im 142/142]
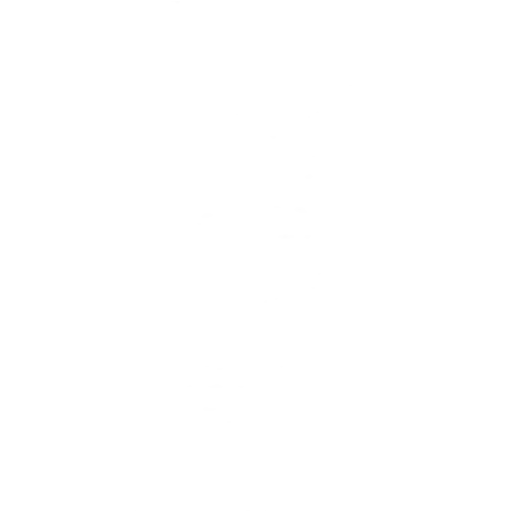

[Series 608: pet sagittal · 2 of 162 slices shown]
[im 1/162]
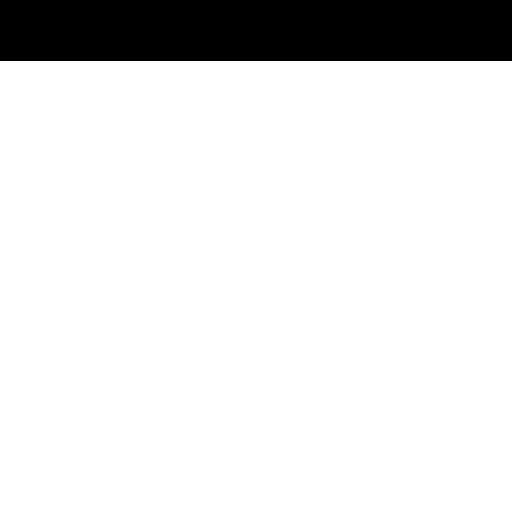
[im 162/162]
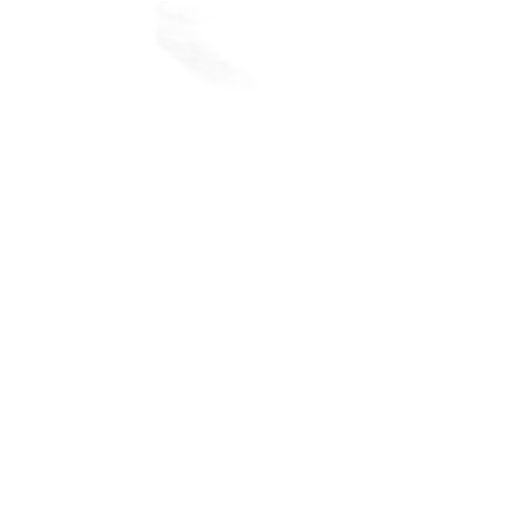

[24 of 25 positions shown; findings below may reference images not displayed]

FINDINGS: Mediastinal blood pool activity: SUV max

NECK:

No hypermetabolic cervical lymph nodes are identified.There are no
lesions of the pharyngeal mucosal space.

Incidental CT findings: none

CHEST:

There are no hypermetabolic mediastinal, hilar or axillary lymph
nodes. The previously demonstrated branching nodular density
posteriorly in the right lower lobe is unchanged in appearance,
measuring 1.3 x 1.1 cm on image 112/3. This structure is not
hypermetabolic (SUV max 1.4). No hypermetabolic pulmonary activity
or suspicious nodularity identified.

Incidental CT findings: none

ABDOMEN/PELVIS:

There is no hypermetabolic activity within the liver, adrenal
glands, spleen or pancreas. There is a single hypermetabolic left
inguinal lymph node, measuring 8 mm on image 247/3. This has an SUV
max of 5.4. No other hypermetabolic abdominopelvic lymph nodes
identified. No suspicious/focal bowel activity.

Incidental CT findings: Grossly stable small low-density hepatic
lesions without metabolic activity, likely cysts. Minimal aortic
atherosclerosis and dystrophic calcifications in the prostate gland.

SKELETON:

There is no hypermetabolic activity to suggest osseous metastatic
disease.

Incidental CT findings: none
IMPRESSION: 1. The previously demonstrated branching nodule in the right lower
lobe demonstrates no hypermetabolic activity and remains favored to
reflect chronic mucoid impaction. Consider chest CT follow-in 6-12
months to assess stability.
2. No evidence of thoracic malignancy.
3. Single indeterminate small hypermetabolic left inguinal lymph
node which could be inflammatory/reactive or neoplastic. Recommend
clinical evaluation and consideration of follow-up pelvic CT in 6-12
months to assess stability (which could be combined with the chest
CT).

## 2023-01-01 ENCOUNTER — Ambulatory Visit: Payer: 59 | Admitting: Podiatry

## 2023-01-08 ENCOUNTER — Ambulatory Visit: Payer: 59 | Admitting: Podiatry

## 2023-01-15 ENCOUNTER — Ambulatory Visit: Payer: 59 | Admitting: Podiatry

## 2023-01-25 ENCOUNTER — Encounter: Payer: Self-pay | Admitting: Podiatry

## 2023-01-25 ENCOUNTER — Ambulatory Visit: Payer: 59 | Admitting: Podiatry

## 2023-01-25 DIAGNOSIS — M722 Plantar fascial fibromatosis: Secondary | ICD-10-CM | POA: Diagnosis not present

## 2023-01-25 DIAGNOSIS — L600 Ingrowing nail: Secondary | ICD-10-CM

## 2023-01-25 MED ORDER — TRIAMCINOLONE ACETONIDE 10 MG/ML IJ SUSP
10.0000 mg | Freq: Once | INTRAMUSCULAR | Status: AC
Start: 2023-01-25 — End: 2023-01-25
  Administered 2023-01-25: 10 mg via INTRA_ARTICULAR

## 2023-01-25 NOTE — Progress Notes (Signed)
Subjective:   Patient ID: Dillon Richards, male   DOB: 62 y.o.   MRN: [id number]   HPI Patient states that his heel left has started to get achy and sore and he has problems with both of his fifth toenails that get thick and irritated and make it hard for him to wear shoe gear comfortably.  States that he did have surgery on his heel about 4 years ago that did well but he is having problems at this time.  Patient does not smoke likes to be active and the pain is more achy   Review of Systems  All other systems reviewed and are negative.       Objective:  Physical Exam Vitals and nursing note reviewed.  Constitutional:      Appearance: He is well-developed.  Pulmonary:     Effort: Pulmonary effort is normal.  Musculoskeletal:        General: Normal range of motion.  Skin:    General: Skin is warm.  Neurological:     Mental Status: He is alert.     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  Applied fascial brace to lift up the arch gave instructions on supportive shoes  X-rays indicate small spur no indication stress fracture arthritis or other pathology

## 2023-01-30 ENCOUNTER — Telehealth: Payer: Self-pay | Admitting: Cardiology

## 2023-01-30 DIAGNOSIS — Z79899 Other long term (current) drug therapy: Secondary | ICD-10-CM

## 2023-01-30 MED ORDER — AMLODIPINE BESYLATE 5 MG PO TABS
5.0000 mg | ORAL_TABLET | Freq: Every day | ORAL | 3 refills | Status: DC
Start: 2023-01-30 — End: 2023-06-28

## 2023-01-30 NOTE — Telephone Encounter (Signed)
Pt c/o BP issue: STAT if pt c/o blurred vision, one-sided weakness or slurred speech  1. What are your last 5 BP readings? 150's/100's  2. Are you having any other symptoms (ex. Dizziness, headache, blurred vision, passed out)? Headaches   3. What is your BP issue? Patient states his BP has been running high. Patient states he is starting to get a headache from the high BP.

## 2023-01-30 NOTE — Telephone Encounter (Signed)
Left the pt a message to call the office back. 

## 2023-01-30 NOTE — Telephone Encounter (Signed)
Left voicemail to return call to office.

## 2023-01-30 NOTE — Telephone Encounter (Signed)
Spike with patient and he states his BP has been really high over the past two weeks and causing him to have slight headaches. He states this morning BP was 154/100. Denies any chest pain currently but he experience slight chest pain a week ago. No blurred vision,  slurred speech or arm pain.   Advised to continue monitor BP and monitor salt intake. ED precautions discussed. Appointment scheduled.

## 2023-01-30 NOTE — Telephone Encounter (Signed)
Pt returned a call back to the office.   He is aware that Dr. Rosemary Holms ordered for him to start amlodipine 5 mg po daily for better BP control.    Confirmed the pharmacy of choice with the pt.   Pt is aware to really decrease his salt intake, continue monitoring pressures at home, and utilize healthy lifestyle modification with diet and exercise.   Advised him to follow-up as planned with Dr. Rosemary Holms next Tuesday 10/29.   Pt verbalized understanding and agrees with this plan.

## 2023-01-30 NOTE — Telephone Encounter (Signed)
Noted. In the meantime, we can send amlodipine 5 mg daily for blood pressure control.  Thanks MJP

## 2023-02-06 ENCOUNTER — Telehealth: Payer: Self-pay | Admitting: *Deleted

## 2023-02-06 ENCOUNTER — Encounter: Payer: Self-pay | Admitting: Cardiology

## 2023-02-06 ENCOUNTER — Ambulatory Visit: Payer: 59 | Attending: Cardiology | Admitting: Cardiology

## 2023-02-06 VITALS — BP 120/74 | HR 82 | Resp 16 | Ht 74.0 in | Wt 205.0 lb

## 2023-02-06 DIAGNOSIS — R0609 Other forms of dyspnea: Secondary | ICD-10-CM | POA: Diagnosis not present

## 2023-02-06 DIAGNOSIS — I10 Essential (primary) hypertension: Secondary | ICD-10-CM | POA: Diagnosis not present

## 2023-02-06 DIAGNOSIS — Z79899 Other long term (current) drug therapy: Secondary | ICD-10-CM | POA: Diagnosis not present

## 2023-02-06 DIAGNOSIS — R072 Precordial pain: Secondary | ICD-10-CM | POA: Diagnosis not present

## 2023-02-06 MED ORDER — METOPROLOL TARTRATE 100 MG PO TABS: 100.0000 mg | ORAL_TABLET | Freq: Once | ORAL | 0 refills | Status: DC

## 2023-02-06 NOTE — Telephone Encounter (Signed)
-----   Message from [place] sent at 02/06/2023  2:39 PM EDT ----- Regarding: Ct scan Scheduled 11/11 at 2:00

## 2023-02-06 NOTE — Progress Notes (Signed)
Cardiology Office Note:  .   Date:  02/06/2023  ID:  [name], DOB 1960/10/04, MRN [id number] PCP: Nonnie Done., MD   [facility]Care Providers Cardiologist:  Truett Mainland, MD PCP: Nonnie Done., MD  Chief Complaint  Patient presents with   Chest Pain   Hypertension      History of Present Illness: .    Dillon Richards is a 62 y.o. male with hypertension, chest pain, exertional dyspnea  Patient was previously seen in 2022, stress test in 2020 was unremarkable.  Patient continues to have occasional left-sided chest pain, and exertional dyspnea with walking uphill.  Blood pressure has been elevated at home, although is fairly normal today.  He has noticed headache that wakes him up at night, that is thus far been up attributed to hypertension.  He is not taking amlodipine with improvement in blood pressure as checked in the office today.  However, his home blood pressure readings are much higher.  Vitals:   02/06/23 1157  BP: 120/74  Pulse: 82  Resp: 16  SpO2: 98%     ROS:  Review of Systems  Cardiovascular:  Positive for chest pain and dyspnea on exertion. Negative for leg swelling, palpitations and syncope.  Neurological:  Positive for headaches.     Studies Reviewed: Marland Kitchen       EKG 02/06/2023: Normal sinus rhythm Normal ECG No previous ECGs available     Physical Exam:   Physical Exam Vitals and nursing note reviewed.  Constitutional:      General: He is not in acute distress. Neck:     Vascular: No JVD.  Cardiovascular:     Rate and Rhythm: Normal rate and regular rhythm.     Heart sounds: Normal heart sounds. No murmur heard. Pulmonary:     Effort: Pulmonary effort is normal.     Breath sounds: Normal breath sounds. No wheezing or rales.  Musculoskeletal:     Right lower leg: No edema.     Left lower leg: No edema.      VISIT DIAGNOSES:   ICD-10-CM   1. Precordial pain  R07.2 EKG 12-Lead    CT CORONARY MORPH W/CTA COR  W/SCORE W/CA W/CM &/OR WO/CM    metoprolol tartrate (LOPRESSOR) 100 MG tablet    Basic metabolic panel    Lipid Profile    2. Medication management  Z79.899 Basic metabolic panel    Lipid Profile    3. DOE (dyspnea on exertion)  R06.09 Basic metabolic panel    Lipid Profile    ECHOCARDIOGRAM COMPLETE    4. Primary hypertension  I10        ASSESSMENT AND PLAN: .    Dillon Richards is a 62 y.o. male with hypertension, chest pain, exertional dyspnea  Chest pain, exertional dyspnea: Previous negative stress test in 2020.  Recommend echocardiogram and coronary CT angiogram for definitive coronary evaluation.  If obstructive CAD excluded, it may be reasonable to discontinue aspirin  Hypertension: Blood pressure is normal today on amlodipine 5 mg daily.  I have encouraged him to keep a blood pressure log at home and bring his monitor to compare with office monitor, at next visit.  If headaches persist in spite of controlled blood pressure, he may need further evaluation for headache, defer to PCP     Meds ordered this encounter  Medications   metoprolol tartrate (LOPRESSOR) 100 MG tablet    Sig: Take 1 tablet (100 mg total) by mouth once for 1 dose.  Take 90-120 minutes prior to scan. Hold for SBP less than 110.    Dispense:  1 tablet    Refill:  0     F/u in 4 to 6 weeks with APP  Signed, Elder Negus, MD

## 2023-02-06 NOTE — Patient Instructions (Signed)
Medication Instructions:   Your physician recommends that you continue on your current medications as directed. Please refer to the Current Medication list given to you today.  *If you need a refill on your cardiac medications before your next appointment, please call your pharmacy*   Lab Work:  TODAY--BMET AND LIPIDS   If you have labs (blood work) drawn today and your tests are completely normal, you will receive your results only by: MyChart Message (if you have MyChart) OR A paper copy in the mail If you have any lab test that is abnormal or we need to change your treatment, we will call you to review the results.   Testing/Procedures:  Your physician has requested that you have an echocardiogram. Echocardiography is a painless test that uses sound waves to create images of your heart. It provides your doctor with information about the size and shape of your heart and how well your heart's chambers and valves are working. This procedure takes approximately one hour. There are no restrictions for this procedure. Please do NOT wear cologne, perfume, aftershave, or lotions (deodorant is allowed). Please arrive 15 minutes prior to your appointment time.     Your cardiac CT will be scheduled at one of the below locations:   [facility] [address] [place], [place] [address] [phone]   If scheduled at [facility], please arrive at the [facility] and Children's Entrance (Entrance C2) of [facility] 30 minutes prior to test start time. You can use the FREE valet parking offered at entrance C (encouraged to control the heart rate for the test)  Proceed to the [facility] Radiology Department (first floor) to check-in and test prep.  All radiology patients and guests should use entrance C2 at [facility], accessed from [facility], even though the hospital's physical address listed is [address].      Please  follow these instructions carefully (unless otherwise directed):  An IV will be required for this test and Nitroglycerin will be given.  Hold all erectile dysfunction medications at least 3 days (72 hrs) prior to test. (Ie viagra, cialis, sildenafil, tadalafil, etc)   On the Night Before the Test: Be sure to Drink plenty of water. Do not consume any caffeinated/decaffeinated beverages or chocolate 12 hours prior to your test. Do not take any antihistamines 12 hours prior to your test.   On the Day of the Test: Drink plenty of water until 1 hour prior to the test. Do not eat any food 1 hour prior to test. You may take your regular medications prior to the test.  Take metoprolol 100 MG BY MOUTH (Lopressor) two hours prior to test.        After the Test: Drink plenty of water. After receiving IV contrast, you may experience a mild flushed feeling. This is normal. On occasion, you may experience a mild rash up to 24 hours after the test. This is not dangerous. If this occurs, you can take Benadryl 25 mg and increase your fluid intake. If you experience trouble breathing, this can be serious. If it is severe call 911 IMMEDIATELY. If it is mild, please call our office.   We will call to schedule your test 2-4 weeks out understanding that some insurance companies will need an authorization prior to the service being performed.   For more information and frequently asked questions, please visit our website : [email or web address]  For non-scheduling related questions, please contact the  cardiac imaging nurse navigator should you have any questions/concerns: Cardiac Imaging Nurse Navigators Direct Office Dial: [phone]   For scheduling needs, including cancellations and rescheduling, please call [place], [phone].    Follow-Up:  6-8 WEEKS WITH AN EXTENDER IN THE OFFICE

## 2023-02-07 LAB — BASIC METABOLIC PANEL
BUN/Creatinine Ratio: 13 (ref 10–24)
BUN: 12 mg/dL (ref 8–27)
CO2: 27 mmol/L (ref 20–29)
Calcium: 9.3 mg/dL (ref 8.6–10.2)
Chloride: 100 mmol/L (ref 96–106)
Creatinine, Ser: 0.9 mg/dL (ref 0.76–1.27)
Glucose: 85 mg/dL (ref 70–99)
Potassium: 4.8 mmol/L (ref 3.5–5.2)
Sodium: 140 mmol/L (ref 134–144)
eGFR: 97 mL/min/{1.73_m2} (ref >59)

## 2023-02-07 LAB — LIPID PANEL
Chol/HDL Ratio: 4.3 ratio (ref 0.0–5.0)
Cholesterol, Total: 200 mg/dL — ABNORMAL HIGH (ref 100–199)
HDL: 47 mg/dL (ref >39)
LDL Chol Calc (NIH): 128 mg/dL — ABNORMAL HIGH (ref 0–99)
Triglycerides: 137 mg/dL (ref 0–149)
VLDL Cholesterol Cal: 25 mg/dL (ref 5–40)

## 2023-02-09 ENCOUNTER — Telehealth: Payer: Self-pay

## 2023-02-09 DIAGNOSIS — E7800 Pure hypercholesterolemia, unspecified: Secondary | ICD-10-CM

## 2023-02-09 NOTE — Telephone Encounter (Signed)
-----   Message from [facility] sent at 02/09/2023  9:04 AM EDT ----- Cholesterol is elevated. In addition to heart healthy diet and lifestyle, recommend Crestor 20 mg or Lipitor 40 mg daily and repeat lipid panel in 3 months.  Thanks MJP

## 2023-02-09 NOTE — Telephone Encounter (Signed)
The patient has been notified of the result and verbalized understanding.  All questions (if any) were answered. Cindi [facility] [place], RN 02/09/2023 10:36 AM   Patient stated he would like to try life style and diet changes first. Then he would like to recheck lab work in 3 month and if his cholesterol is still elevated he will try medication.  Patient would like to reschedule his echo on Tuesday will send to Scheduler.

## 2023-02-09 NOTE — Progress Notes (Signed)
Noted ? ?Thanks ?MJP ? ?

## 2023-02-09 NOTE — Progress Notes (Signed)
Cholesterol is elevated. In addition to heart healthy diet and lifestyle, recommend Crestor 20 mg or Lipitor 40 mg daily and repeat lipid panel in 3 months.  Thanks MJP

## 2023-02-13 ENCOUNTER — Other Ambulatory Visit (HOSPITAL_COMMUNITY): Payer: 59

## 2023-02-16 ENCOUNTER — Telehealth (HOSPITAL_COMMUNITY): Payer: Self-pay | Admitting: *Deleted

## 2023-02-16 NOTE — Telephone Encounter (Signed)
Reaching out to patient to offer assistance regarding upcoming cardiac imaging study; pt verbalizes understanding of appt date/time, parking situation and where to check in, pre-test NPO status and medications ordered, and verified current allergies; name and call back number provided for further questions should they arise Hayley Sharpe RN Navigator Cardiac Imaging [facility] [phone] office [phone] cell  

## 2023-02-19 ENCOUNTER — Ambulatory Visit (HOSPITAL_COMMUNITY)
Admission: RE | Admit: 2023-02-19 | Discharge: 2023-02-19 | Disposition: A | Discharge status: Home / Self Care | Payer: 59 | Source: Ambulatory Visit | Attending: Cardiology | Admitting: Cardiology

## 2023-02-19 DIAGNOSIS — R072 Precordial pain: Secondary | ICD-10-CM | POA: Insufficient documentation

## 2023-02-19 MED ORDER — IOPAMIDOL (ISOVUE-370) INJECTION 76%
95.0000 mL | Freq: Once | INTRAVENOUS | Status: AC | PRN
Start: 2023-02-19 — End: 2023-02-19
  Administered 2023-02-19: 95 mL via INTRAVENOUS

## 2023-02-19 MED ORDER — NITROGLYCERIN 0.4 MG SL SUBL
0.8000 mg | SUBLINGUAL_TABLET | Freq: Once | SUBLINGUAL | Status: AC
  Administered 2023-02-19: 0.8 mg via SUBLINGUAL

## 2023-02-19 MED ORDER — NITROGLYCERIN 0.4 MG SL SUBL
SUBLINGUAL_TABLET | SUBLINGUAL | Status: AC
  Filled 2023-02-19: qty 2

## 2023-02-19 NOTE — Progress Notes (Signed)
Mild nonobstructive coronary artery disease. Continue aggressive attempts at heart healthy diet and lifestyle. If cholesterol does not reduce on next lipid panel, strongly recommend statin.  Chest pain unlikely to be due to CAD. Incidental finding of PFO, present in 1/4 individuals. Treatment is only needed in patients with stroke for no other clear cause.   Thanks MJP

## 2023-02-22 ENCOUNTER — Encounter: Payer: Self-pay | Admitting: Podiatry

## 2023-02-22 ENCOUNTER — Ambulatory Visit: Payer: 59 | Admitting: Podiatry

## 2023-02-22 DIAGNOSIS — L600 Ingrowing nail: Secondary | ICD-10-CM

## 2023-02-22 NOTE — Patient Instructions (Signed)

## 2023-02-23 NOTE — Progress Notes (Signed)
Subjective:   Patient ID: Dillon Richards, male   DOB: 62 y.o.   MRN: [id number]   HPI Patient states he still gets pain in the mid arch area left even though it is not as bad as it used to be and does have severely thickened dystrophic fifth nails both feet that he knows he needs to have removed   ROS      Objective:  Physical Exam  Neurovascular status intact with thick deformed fifth nails bilateral and mild to moderate discomfort in the mid arch area left     Assessment:  Chronic damage with pain fifth toenails both feet with moderate rotation of the toes along with inflammation and pain of the mid arch area left not severe but bothersome     Plan:  H&P reviewed both conditions.  I do think for the arch I do not see a lot of real good things we can do with it and I do think that he is just can have to continue the stretching and all the things he does to keep it under control.  I have recommended removal of the nails and I explained procedure risk and after conversation he read and signed consent form understanding risk.  I infiltrated each fifth toe 60 mg Xylocaine Marcaine mixture sterile prep done and using sterile instrumentation removed the nails exposed matrix applied phenol 3 applications 30 seconds followed by alcohol lavage and sterile dressings.  Gave instructions on wearing dressings 24 hours take them off earlier if throbbing were to occur and all questions answered today

## 2023-03-15 ENCOUNTER — Ambulatory Visit (HOSPITAL_COMMUNITY): Payer: 59 | Attending: Cardiology

## 2023-03-15 ENCOUNTER — Telehealth: Payer: Self-pay | Admitting: Podiatry

## 2023-03-15 DIAGNOSIS — R0609 Other forms of dyspnea: Secondary | ICD-10-CM

## 2023-03-15 LAB — ECHOCARDIOGRAM COMPLETE: S' Lateral: 2.41 cm

## 2023-03-15 NOTE — Telephone Encounter (Signed)
Patient called stating that he had a few nails removed about three weeks ago(11/14), he said he has some concerns regarding the healing process given its almost been 3 weeks since having them removed. Patient stated there is still some pain,discomfort,and discoloration. Please call this patient to go over some concerns he is having.  Thank you

## 2023-03-15 NOTE — Telephone Encounter (Signed)
This is a normal process and should improve over the next few weeks. Herbert Seta, if you can please call and let him know

## 2023-03-16 NOTE — Progress Notes (Unsigned)
Cardiology Office Note    Date:  03/16/2023   ID:  Dillon Richards, DOB 1960-12-15, MRN [id number]  PCP:  Dillon Done., MD  Cardiologist:  None  Electrophysiologist:  None   Chief Complaint: ***  History of Present Illness:   Dillon Richards is a 62 y.o. male with history of HTN, sleep apnea on CPAP, BPH, recently identified nonobstructive CAD, PFO, HLD, mild dilation of ascending aorta seen for follow-up of testing. Remote echo and CPX 2022 were reassuring. He was recently seen by Dr. Rosemary Richards for precordial pain and exertional dyspnea. F/u echo 03/15/23 EF 60-65%%, G1DD, mild dilation of ascending aorta, trivial MR. Cor CT showed CAC 0, nonobstructive CAD, with noncalcified plaque in left main causing mild (25-49%) stenosis, PFO.  Lipid plan Asa    Exertional dyspnea, chest discomfort, mild CAD, HLD PFO Mild dilation of ascending aorta HTN   Labwork independently reviewed: 01/2023 LDL 128, trig 137, K 4.7, Cr 0.90 11/2020 Hgb 14.4, plt 249, LFTs ok  Past History   Past Medical History:  Diagnosis Date   BPH (benign prostatic hyperplasia)    Chest pain    DOE (dyspnea on exertion)    Sleep apnea    On CPAP machine     Past Surgical History:  Procedure Laterality Date   COLONOSCOPY  03/13/2012   Small internal hemorrhoids. Otherwise normal colonoscopy. The colon was highly redundant   COLONOSCOPY  09/11/2019   HERNIA REPAIR  june 2013   SHOULDER ARTHROSCOPY WITH SUBACROMIAL DECOMPRESSION  03/20/2012   Procedure: SHOULDER ARTHROSCOPY WITH SUBACROMIAL DECOMPRESSION;  Surgeon: Drucilla Schmidt, MD;  Location: [facility];  Service: Orthopedics;  Laterality: Left;  WITH OPEN DISTAL CLAVICLE RESECTION   TONSILLECTOMY  62 years old    Current Medications: No outpatient medications have been marked as taking for the 03/21/23 encounter (Appointment) with Dillon Montana, PA-C.   ***   Allergies:   Patient has no known allergies.   Social History   Socioeconomic  History   Marital status: Married    Spouse name: 2   Number of children: Not on file   Years of education: Not on file   Highest education level: Not on file  Occupational History   Occupation: Emergency planning/management officer  Tobacco Use   Smoking status: Former    Current packs/day: 0.00    Average packs/day: 0.1 packs/day for 5.0 years (0.3 ttl pk-yrs)    Types: Cigarettes    Start date: 80    Quit date: 1970    Years since quitting: 54.9   Smokeless tobacco: Never   Tobacco comments:    smoked as a teenager   Vaping Use   Vaping status: Never Used  Substance and Sexual Activity   Alcohol use: No   Drug use: No   Sexual activity: Not on file  Other Topics Concern   Not on file  Social History Narrative   Not on file   Social Determinants of Health   Financial Resource Strain: Not on file  Food Insecurity: Not on file  Transportation Needs: Not on file  Physical Activity: Not on file  Stress: Not on file  Social Connections: Not on file     Family History:  The patient's ***family history includes Anuerysm in his father; Arrhythmia in his brother; Breast cancer in his mother; Colon polyps in his father; Hypertension in his father; Other in his sister; Stroke in his mother. There is no history of Colon cancer, Esophageal cancer, Rectal cancer,  or Stomach cancer.  ROS:   Please see the history of present illness. Otherwise, review of systems is positive for ***.  All other systems are reviewed and otherwise negative.    EKG(s)/Additional Testing   EKG:  EKG is ordered today, personally reviewed, demonstrating ***  CV Studies: Cardiac studies reviewed are outlined and summarized above. Otherwise please see EMR for full report.  Recent Labs: 02/06/2023: BUN 12; Creatinine, Ser 0.90; Potassium 4.8; Sodium 140  Recent Lipid Panel    Component Value Date/Time   CHOL 200 (H) 02/06/2023 1254   TRIG 137 02/06/2023 1254   HDL 47 02/06/2023 1254   CHOLHDL 4.3 02/06/2023 1254    LDLCALC 128 (H) 02/06/2023 1254    PHYSICAL EXAM:    VS:  There were no vitals taken for this visit.  BMI: There is no height or weight on file to calculate BMI.  GEN: Well nourished, well developed male in no acute distress HEENT: normocephalic, atraumatic Neck: no JVD, carotid bruits, or masses Cardiac: ***RRR; no murmurs, rubs, or gallops, no edema  Respiratory:  clear to auscultation bilaterally, normal work of breathing GI: soft, nontender, nondistended, + BS MS: no deformity or atrophy Skin: warm and dry, no rash Neuro:  Alert and Oriented x 3, Strength and sensation are intact, follows commands Psych: euthymic mood, full affect  Wt Readings from Last 3 Encounters:  02/06/23 205 lb (93 kg)  11/03/20 202 lb (91.6 kg)  08/06/20 205 lb (93 kg)     ASSESSMENT & PLAN:   ***     Disposition: F/u with ***   Medication Adjustments/Labs and Tests Ordered: Current medicines are reviewed at length with the patient today.  Concerns regarding medicines are outlined above. Medication changes, Labs and Tests ordered today are summarized above and listed in the Patient Instructions accessible in Encounters.   Signed, Dillon Montana, PA-C  03/16/2023 10:39 AM    [facility] Phone: [phone]; Fax: [phone]

## 2023-03-21 ENCOUNTER — Encounter: Payer: Self-pay | Admitting: Physician Assistant

## 2023-03-21 ENCOUNTER — Ambulatory Visit: Payer: 59 | Attending: Physician Assistant | Admitting: Physician Assistant

## 2023-03-21 VITALS — BP 118/68 | HR 93 | Ht 74.0 in | Wt 206.0 lb

## 2023-03-21 DIAGNOSIS — E785 Hyperlipidemia, unspecified: Secondary | ICD-10-CM

## 2023-03-21 DIAGNOSIS — I77810 Thoracic aortic ectasia: Secondary | ICD-10-CM

## 2023-03-21 DIAGNOSIS — Q2112 Patent foramen ovale: Secondary | ICD-10-CM

## 2023-03-21 DIAGNOSIS — I2510 Atherosclerotic heart disease of native coronary artery without angina pectoris: Secondary | ICD-10-CM

## 2023-03-21 DIAGNOSIS — I10 Essential (primary) hypertension: Secondary | ICD-10-CM

## 2023-03-21 DIAGNOSIS — R0609 Other forms of dyspnea: Secondary | ICD-10-CM | POA: Diagnosis not present

## 2023-03-21 NOTE — Patient Instructions (Addendum)
Medication Instructions:    START TAKING : ASPIRIN 81 MG ONCE A DAY   *If you need a refill on your cardiac medications before your next appointment, please call your pharmacy*   Lab Work:  CBC  AND TSH TODAY   If you have labs (blood work) drawn today and your tests are completely normal, you will receive your results only by: MyChart Message (if you have MyChart) OR A paper copy in the mail If you have any lab test that is abnormal or we need to change your treatment, we will call you to review the results.   Testing/Procedures: IN  03-2024 Your physician has requested that you have an echocardiogram. Echocardiography is a painless test that uses sound waves to create images of your heart. It provides your doctor with information about the size and shape of your heart and how well your heart's chambers and valves are working. This procedure takes approximately one hour. There are no restrictions for this procedure. Please do NOT wear cologne, perfume, aftershave, or lotions (deodorant is allowed). Please arrive 15 minutes prior to your appointment time.  Please note: We ask at that you not bring children with you during ultrasound (echo/ vascular) testing. Due to room size and safety concerns, children are not allowed in the ultrasound rooms during exams. Our front office staff cannot provide observation of children in our lobby area while testing is being conducted. An adult accompanying a patient to their appointment will only be allowed in the ultrasound room at the discretion of the ultrasound technician under special circumstances. We apologize for any inconvenience.     Follow-Up: At [facility], you and your health needs are our priority.  As part of our continuing mission to provide you with exceptional heart care, we have created designated Provider Care Teams.  These Care Teams include your primary Cardiologist (physician) and Advanced Practice Providers (APPs -   Physician Assistants and Nurse Practitioners) who all work together to provide you with the care you need, when you need it.  We recommend signing up for the patient portal called "MyChart".  Sign up information is provided on this After Visit Summary.  MyChart is used to connect with patients for Virtual Visits (Telemedicine).  Patients are able to view lab/test results, encounter notes, upcoming appointments, etc.  Non-urgent messages can be sent to your provider as well.   To learn more about what you can do with MyChart, go to [email or web address].    Your next appointment:   1 year(s)  Provider:   DR Rosemary Holms / Ronie Spies    Other Instructions

## 2023-03-22 LAB — CBC
Hematocrit: 47.4 % (ref 37.5–51.0)
Hemoglobin: 15.3 g/dL (ref 13.0–17.7)
MCH: 30.5 pg (ref 26.6–33.0)
MCHC: 32.3 g/dL (ref 31.5–35.7)
MCV: 95 fL (ref 79–97)
Platelets: 265 10*3/uL (ref 150–450)
RBC: 5.01 x10E6/uL (ref 4.14–5.80)
RDW: 13.3 % (ref 11.6–15.4)
WBC: 5.1 10*3/uL (ref 3.4–10.8)

## 2023-03-22 LAB — TSH: TSH: 1.1 u[IU]/mL (ref 0.450–4.500)

## 2023-05-02 ENCOUNTER — Institutional Professional Consult (permissible substitution) (INDEPENDENT_AMBULATORY_CARE_PROVIDER_SITE_OTHER): Payer: 59 | Admitting: Otolaryngology

## 2023-05-07 ENCOUNTER — Telehealth: Payer: Self-pay | Admitting: Podiatry

## 2023-05-07 NOTE — Telephone Encounter (Signed)
Patient called previously and has not heard from nurse. [phone]/ Patient is concerned about both feet small toes/discoloration and thick skin.

## 2023-05-09 NOTE — Telephone Encounter (Signed)
Probably related to code exposure. Should be fine

## 2023-06-08 ENCOUNTER — Ambulatory Visit (INDEPENDENT_AMBULATORY_CARE_PROVIDER_SITE_OTHER): Payer: 59 | Admitting: Otolaryngology

## 2023-06-08 ENCOUNTER — Encounter (INDEPENDENT_AMBULATORY_CARE_PROVIDER_SITE_OTHER): Payer: Self-pay | Admitting: Otolaryngology

## 2023-06-08 VITALS — BP 138/81 | HR 74 | Ht 75.0 in | Wt 200.0 lb

## 2023-06-08 DIAGNOSIS — J343 Hypertrophy of nasal turbinates: Secondary | ICD-10-CM

## 2023-06-08 DIAGNOSIS — K219 Gastro-esophageal reflux disease without esophagitis: Secondary | ICD-10-CM | POA: Diagnosis not present

## 2023-06-08 DIAGNOSIS — R0982 Postnasal drip: Secondary | ICD-10-CM

## 2023-06-08 DIAGNOSIS — J383 Other diseases of vocal cords: Secondary | ICD-10-CM

## 2023-06-08 DIAGNOSIS — R0989 Other specified symptoms and signs involving the circulatory and respiratory systems: Secondary | ICD-10-CM

## 2023-06-08 DIAGNOSIS — G4733 Obstructive sleep apnea (adult) (pediatric): Secondary | ICD-10-CM

## 2023-06-08 DIAGNOSIS — J3089 Other allergic rhinitis: Secondary | ICD-10-CM

## 2023-06-08 DIAGNOSIS — R0981 Nasal congestion: Secondary | ICD-10-CM | POA: Diagnosis not present

## 2023-06-08 DIAGNOSIS — Z9109 Other allergy status, other than to drugs and biological substances: Secondary | ICD-10-CM

## 2023-06-08 DIAGNOSIS — R490 Dysphonia: Secondary | ICD-10-CM | POA: Diagnosis not present

## 2023-06-08 MED ORDER — CETIRIZINE HCL 10 MG PO TABS: 10.0000 mg | ORAL_TABLET | Freq: Every day | ORAL | 11 refills | Status: AC

## 2023-06-08 MED ORDER — FAMOTIDINE 20 MG PO TABS: 20.0000 mg | ORAL_TABLET | Freq: Two times a day (BID) | ORAL | 1 refills | Status: AC

## 2023-06-08 MED ORDER — FLUTICASONE PROPIONATE 50 MCG/ACT NA SUSP: 2.0000 | Freq: Every day | NASAL | 6 refills | Status: AC

## 2023-06-08 NOTE — Progress Notes (Signed)
 ENT CONSULT:  Reason for Consult: raspy voice x 6-8 months, chronic nasal congestion and post-nasal drainage    HPI:  63 year old male with history of OSA currently trying oral appliance, previously on CPAP, here for 8 months of raspy voice and chronic nasal congestion with postnasal drainage.  He reports that he has frequent throat clearing which gets worse in the morning and after eating.  He sometimes has worsening of raspy voice during video calls or presentations.  Not on any medications for heartburn or reflux.  He reports history of environmental allergies, thinks that he was previously tested but long time ago.  Currently not taking anything for postnasal drainage.  Denies history of smoking.  No trouble breathing or swallowing problems.    Records Reviewed:  Visit with Ronie Spies PA Cardiology for DOE  Abdel Effinger is a 63 y.o. male with history of HTN, sleep apnea, BPH, recently identified nonobstructive CAD, PFO, HLD, mild dilation of ascending aorta seen for follow-up of testing. Remote echo and CPX 2022 were reassuring. He was recently seen by Dr. Rosemary Holms for precordial pain and exertional dyspnea. F/u echo 03/15/23 EF 60-65%%, G1DD, mild dilation of ascending aorta, trivial MR. Cor CT showed CAC 0, nonobstructive CAD, with noncalcified plaque in left main causing mild (25-49%) stenosis, PFO.    He returns for follow-up feeling well, no recent angina. He has had mild exertional dyspnea for many years and reports seeing pulmonology in the past. He is the process of trialing mouth guards for his OSA but may end up returning to CPAP based on limited success. He reports home BPs trending higher in the diastolics. BP 124/78 here, recheck 118/68 by me, similar in both arms.   Exertional dyspnea, chest discomfort, mild CAD, HLD - reassuring cardiac workup. No s/sx of heart failure at this time. Cor CTA reassuring. Will get f/u baseline CBC for completeness. Per d/w Dr. Rosemary Holms, start  ASA 81mg  daily given LM findings. Continue amlodipine. He had many questions about statins which I did my best to answer. At the time of prior labs he was not fasting and declined to start statin. He reports 3 out of 5 prior coworkers had issues with statin therapy. He is having fasting labs by PCP with physical next week. I told him he can either have them manage lipids or send copy to [place] to review and advise if he prefers that we manage this. We discussed rationale of therapy.    Past Medical History:  Diagnosis Date   BPH (benign prostatic hyperplasia)    Chest pain    DOE (dyspnea on exertion)    Sleep apnea    On CPAP machine     Past Surgical History:  Procedure Laterality Date   COLONOSCOPY  03/13/2012   Small internal hemorrhoids. Otherwise normal colonoscopy. The colon was highly redundant   COLONOSCOPY  09/11/2019   HERNIA REPAIR  june 2013   SHOULDER ARTHROSCOPY WITH SUBACROMIAL DECOMPRESSION  03/20/2012   Procedure: SHOULDER ARTHROSCOPY WITH SUBACROMIAL DECOMPRESSION;  Surgeon: Drucilla Schmidt, MD;  Location: [facility];  Service: Orthopedics;  Laterality: Left;  WITH OPEN DISTAL CLAVICLE RESECTION   TONSILLECTOMY  63 years old    Family History  Problem Relation Age of Onset   Breast cancer Mother        stage 1 that changed to liver cancer   Stroke Mother    Anuerysm Father    Hypertension Father    Colon polyps Father    Other Sister  fecal incontinence    Arrhythmia Brother    Colon cancer Neg Hx    Esophageal cancer Neg Hx    Rectal cancer Neg Hx    Stomach cancer Neg Hx     Social History:  reports that he quit smoking about 55 years ago. His smoking use included cigarettes. He started smoking about 60 years ago. He has a 0.3 pack-year smoking history. He has never used smokeless tobacco. He reports that he does not drink alcohol and does not use drugs.  Allergies: No Known Allergies  Medications: I have reviewed the patient's current  medications.  The PMH, PSH, Medications, Allergies, and SH were reviewed and updated.  ROS: Constitutional: Negative for fever, weight loss and weight gain. Cardiovascular: Negative for chest pain and dyspnea on exertion. Respiratory: Is not experiencing shortness of breath at rest. Gastrointestinal: Negative for nausea and vomiting. Neurological: Negative for headaches. Psychiatric: The patient is not nervous/anxious  Blood pressure 138/81, pulse 74, height 6\' 3"  (1.905 m), weight 200 lb (90.7 kg), SpO2 98%. Body mass index is 25 kg/m.  PHYSICAL EXAM:  Exam: General: Well-developed, well-nourished Communication and Voice: raspy Respiratory Respiratory effort: Equal inspiration and expiration without stridor Cardiovascular Peripheral Vascular: Warm extremities with equal color/perfusion Eyes: No nystagmus with equal extraocular motion bilaterally Neuro/Psych/Balance: Patient oriented to person, place, and time; Appropriate mood and affect; Gait is intact with no imbalance; Cranial nerves I-XII are intact Head and Face Inspection: Normocephalic and atraumatic without mass or lesion Palpation: Facial skeleton intact without bony stepoffs Salivary Glands: No mass or tenderness Facial Strength: Facial motility symmetric and full bilaterally ENT Pinna: External ear intact and fully developed External canal: Canal is patent with intact skin Tympanic Membrane: Clear and mobile External Nose: No scar or anatomic deformity Internal Nose: Septum is deviated to the left. No polyp, or purulence. Mucosal edema and erythema present.  Bilateral inferior turbinate hypertrophy.  Lips, Teeth, and gums: Mucosa and teeth intact and viable TMJ: No pain to palpation with full mobility Oral cavity/oropharynx: No erythema or exudate, no lesions present Nasopharynx: No mass or lesion with intact mucosa Hypopharynx: Intact mucosa without pooling of secretions Larynx Glottic: Full true vocal cord  mobility without lesion or mass B/l VF atrophy and glottic insufficiency on strobe see procedure note below  Supraglottic: Normal appearing epiglottis and AE folds Interarytenoid Space: Moderate pachydermia&edema Subglottic Space: Patent without lesion or edema Neck Neck and Trachea: Midline trachea without mass or lesion Thyroid: No mass or nodularity Lymphatics: No lymphadenopathy  Procedure:   Preoperative diagnosis: hoarseness dysphonia   Postoperative diagnosis:   same + GERD LPR, VF atrophy and glottic insufficiency   Procedure: Flexible fiberoptic laryngoscopy with stroboscopy (16109)   Surgeon: Ashok Croon, MD  Anesthesia: Topical lidocaine and Afrin  Complications: None  Condition is stable throughout exam  Indications and consent:   The patient presents to the clinic with hoarseness. All the risks, benefits, and potential complications were reviewed with the patient preoperatively and informed verbal consent was obtained.  Procedure: The patient was seated upright in the exam chair.   Topical lidocaine and Afrin were applied to the nasal cavity. After adequate anesthesia had occurred, the flexible telescope with strobe capabilities was passed into the nasal cavity. The nasopharynx was patent without mass or lesion. The scope was passed behind the soft palate and directed toward the base of tongue. The base of tongue was visualized and was symmetric with no apparent masses or abnormal appearing tissue. There were no  signs of a mass or pooling of secretions in the piriform sinuses. The supraglottic structures were normal.  The true vocal cords are mobile. The medial edges were bowed. Closure was incomplete with spindle-shaped glottic gap. Periodicity present. The mucosal wave and amplitude were intact and normal in amplitude. There is moderate interarytenoid pachydermia and post cricoid edema. The mucosa appears without lesions.   The laryngoscope was then slowly withdrawn  and the patient tolerated the procedure well. There were no complications or blood loss.    PROCEDURE NOTE: nasal endoscopy  Preoperative diagnosis: chronic nasal congestion and post-nasal drip symptoms  Postoperative diagnosis: same  Procedure: Diagnostic nasal endoscopy (16109)  Surgeon: Ashok Croon, M.D.  Anesthesia: Topical lidocaine and Afrin  H&P REVIEW: The patient's history and physical were reviewed today prior to procedure. All medications were reviewed and updated as well. Complications: None Condition is stable throughout exam Indications and consent: The patient presents with symptoms of chronic sinusitis not responding to previous therapies. All the risks, benefits, and potential complications were reviewed with the patient preoperatively and informed consent was obtained. The time out was completed with confirmation of the correct procedure.   Procedure: The patient was seated upright in the clinic. Topical lidocaine and Afrin were applied to the nasal cavity. After adequate anesthesia had occurred, the rigid nasal endoscope was passed into the nasal cavity. The nasal mucosa, turbinates, septum, and sinus drainage pathways were visualized bilaterally. This revealed no purulence or significant secretions that might be cultured. There were no polyps or sites of significant inflammation. The mucosa was intact and there was no crusting present. The scope was then slowly withdrawn and the patient tolerated the procedure well. There were no complications or blood loss.   Studies Reviewed: PET/CT 02/03/2021 done for pulmonary nodule  IMPRESSION: 1. The previously demonstrated branching nodule in the right lower lobe demonstrates no hypermetabolic activity and remains favored to reflect chronic mucoid impaction. Consider chest CT follow-in 6-12 months to assess stability. 2. No evidence of thoracic malignancy. 3. Single indeterminate small hypermetabolic left inguinal  lymph node which could be inflammatory/reactive or neoplastic. Recommend clinical evaluation and consideration of follow-up pelvic CT in 6-12 months to assess stability (which could be combined with the chest  Assessment/Plan: Encounter Diagnoses  Name Primary?   Dysphonia Yes   Hoarseness    Chronic GERD    Chronic throat clearing    Environmental and seasonal allergies    Post-nasal drip    Chronic nasal congestion     Assessment and Plan      Dysphonia Hoarseness x 8 mo He reports raspy voice with gravelly quality especially after prolonged conversations over Zoom or video calls.  Also reports frequent throat clearing around meals and at night in the morning.  Videostrobe exam today with evidence of bilateral vocal fold atrophy and spindle-shaped glottic gap with mucosal wave with normal amplitude and no masses or lesions.  He also had significant changes consistent with GERD LPR and small amount of mucus likely related to postnasal drainage.  We discussed that age-related decline in voice function can be addressed with voice therapy but he would like to hold off at this time.  We also discussed that postnasal drainage and GERD LPR are likely contributing to raspy quality of his voice.  Will focus on medical management of both.  We also discussed that wearing CPAP likely exacerbates both chronic nasal congestion and postnasal drainage as well as severity of his reflux.  -Medical management of GERD  LPR as below -Medical management of chronic nasal congestion environmental allergies and postnasal drainage as below -Will consider voice therapy in the future if no voice improvement  GERD LPR Changes observed on scope exam today consistent with silent reflux and GERD LPR   - Pepcid 20 mg BID  -  Reflux Gourmet after meals - diet and lifestyle changes to minimize GERD - Refer to [employer] blog for dietary and lifestyle modifications/reflux cook book  Chronic nasal congestion  suspected environmental allergies and postnasal drainage Chronic nasal congestion and post-nasal drainage likely contributed to his other symptoms.  Nasal endoscopy today without masses or lesions no polyps or purulence to suggest chronic sinus infection with significant amount of mucosal edema and clear secretions.  He also had small amount of mucus along the vocal cords likely related to postnasal drainage.  Evidence of post-nasal drainage during flexible scope exam today, could be contributing to her sx - trial of Zyrtec 10 mg daily and Flonase 2 puffs b/l nares BID - consider nasal saline rinses   OSA -Continue with a trial of CPAP versus oral appliance -See sleep medicine for follow-up -If unable to tolerate we discussed other interventions such as hypoglossal nerve stimulator  Thank you for allowing me to participate in the care of this patient. Please do not hesitate to contact me with any questions or concerns.   Ashok Croon, MD Otolaryngology [facility] Phone: [phone] Fax: [phone]    06/08/2023, 12:17 PM

## 2023-06-08 NOTE — Patient Instructions (Addendum)

## 2023-06-28 ENCOUNTER — Encounter: Payer: Self-pay | Admitting: Podiatry

## 2023-06-28 ENCOUNTER — Ambulatory Visit (INDEPENDENT_AMBULATORY_CARE_PROVIDER_SITE_OTHER): Admitting: Podiatry

## 2023-06-28 DIAGNOSIS — M722 Plantar fascial fibromatosis: Secondary | ICD-10-CM | POA: Diagnosis not present

## 2023-06-28 DIAGNOSIS — L600 Ingrowing nail: Secondary | ICD-10-CM

## 2023-06-29 NOTE — Progress Notes (Signed)
 Subjective:   Patient ID: Dillon Richards, male   DOB: 63 y.o.   MRN: [id number]   HPI Patient has concerns about the fifth digits of both feet also the heel left bothers him at times   ROS      Objective:  Physical Exam  Neurovascular status intact with crusted tissue dorsal fifth digit bilateral that does not appear to be nail as much is scab formation the possibility of slight ingrown component with plantar fascial inflammation but improving present     Assessment:  Nail condition bilateral with scab tissue with plantar fascial inflammation left     Plan:  H&P reviewed debrided the tissue on the fifth digits no drainage no indication of solid nail formation and reduction of discomfort by doing this and discussed plantar fascia and to continue doing what he is doing at this time

## 2024-03-04 ENCOUNTER — Telehealth (HOSPITAL_COMMUNITY): Payer: Self-pay | Admitting: Physician Assistant

## 2024-03-04 NOTE — Telephone Encounter (Signed)
 03/04/24 patient cancelled Echocardiogram and does not wish to reschedule at this time. Order will be removed from the echo WQ.

## 2024-03-11 ENCOUNTER — Ambulatory Visit (HOSPITAL_COMMUNITY): Payer: 59
